# Patient Record
Sex: Female | Born: 1950 | Race: White | Hispanic: No | Smoking: Never smoker
Health system: Southern US, Community
[De-identification: ages and names within clinical notes are randomized; demographics above are authoritative.]

## PROBLEM LIST (undated history)

## (undated) DIAGNOSIS — F329 Major depressive disorder, single episode, unspecified: Secondary | ICD-10-CM

## (undated) DIAGNOSIS — K5090 Crohn's disease, unspecified, without complications: Secondary | ICD-10-CM

## (undated) DIAGNOSIS — I10 Essential (primary) hypertension: Secondary | ICD-10-CM

## (undated) DIAGNOSIS — R197 Diarrhea, unspecified: Secondary | ICD-10-CM

## (undated) DIAGNOSIS — E669 Obesity, unspecified: Secondary | ICD-10-CM

## (undated) DIAGNOSIS — K5000 Crohn's disease of small intestine without complications: Secondary | ICD-10-CM

## (undated) DIAGNOSIS — Z8614 Personal history of Methicillin resistant Staphylococcus aureus infection: Secondary | ICD-10-CM

## (undated) DIAGNOSIS — F419 Anxiety disorder, unspecified: Secondary | ICD-10-CM

## (undated) DIAGNOSIS — R42 Dizziness and giddiness: Secondary | ICD-10-CM

## (undated) DIAGNOSIS — K589 Irritable bowel syndrome without diarrhea: Secondary | ICD-10-CM

## (undated) DIAGNOSIS — F32A Depression, unspecified: Secondary | ICD-10-CM

## (undated) DIAGNOSIS — E785 Hyperlipidemia, unspecified: Secondary | ICD-10-CM

## (undated) DIAGNOSIS — H409 Unspecified glaucoma: Secondary | ICD-10-CM

## (undated) DIAGNOSIS — Z9889 Other specified postprocedural states: Secondary | ICD-10-CM

## (undated) DIAGNOSIS — K1379 Other lesions of oral mucosa: Secondary | ICD-10-CM

## (undated) DIAGNOSIS — M1990 Unspecified osteoarthritis, unspecified site: Secondary | ICD-10-CM

## (undated) DIAGNOSIS — K529 Noninfective gastroenteritis and colitis, unspecified: Secondary | ICD-10-CM

## (undated) DIAGNOSIS — E039 Hypothyroidism, unspecified: Secondary | ICD-10-CM

## (undated) DIAGNOSIS — R112 Nausea with vomiting, unspecified: Secondary | ICD-10-CM

## (undated) DIAGNOSIS — K219 Gastro-esophageal reflux disease without esophagitis: Secondary | ICD-10-CM

## (undated) DIAGNOSIS — K449 Diaphragmatic hernia without obstruction or gangrene: Secondary | ICD-10-CM

## (undated) HISTORY — DX: Hypothyroidism, unspecified: E03.9

## (undated) HISTORY — DX: Major depressive disorder, single episode, unspecified: F32.9

## (undated) HISTORY — PX: TONSILLECTOMY: SUR1361

## (undated) HISTORY — DX: Anxiety disorder, unspecified: F41.9

## (undated) HISTORY — PX: BACK SURGERY: SHX140

## (undated) HISTORY — DX: Depression, unspecified: F32.A

## (undated) HISTORY — PX: CHOLECYSTECTOMY: SHX55

## (undated) HISTORY — DX: Gastro-esophageal reflux disease without esophagitis: K21.9

## (undated) HISTORY — DX: Essential (primary) hypertension: I10

## (undated) HISTORY — PX: COLONOSCOPY: SHX174

## (undated) HISTORY — DX: Other lesions of oral mucosa: K13.79

## (undated) HISTORY — DX: Personal history of Methicillin resistant Staphylococcus aureus infection: Z86.14

## (undated) HISTORY — PX: CATARACT EXTRACTION, BILATERAL: SHX1313

## (undated) HISTORY — DX: Hyperlipidemia, unspecified: E78.5

## (undated) HISTORY — PX: UPPER GASTROINTESTINAL ENDOSCOPY: SHX188

## (undated) HISTORY — DX: Diarrhea, unspecified: R19.7

## (undated) HISTORY — PX: TUBAL LIGATION: SHX77

## (undated) HISTORY — DX: Crohn's disease of small intestine without complications: K50.00

## (undated) HISTORY — PX: COLON RESECTION: SHX5231

## (undated) HISTORY — DX: Noninfective gastroenteritis and colitis, unspecified: K52.9

## (undated) HISTORY — PX: ESOPHAGOGASTRODUODENOSCOPY: SHX1529

## (undated) HISTORY — DX: Obesity, unspecified: E66.9

## (undated) HISTORY — DX: Irritable bowel syndrome, unspecified: K58.9

---

## 1991-02-28 DIAGNOSIS — K5000 Crohn's disease of small intestine without complications: Secondary | ICD-10-CM

## 1991-02-28 HISTORY — DX: Crohn's disease of small intestine without complications: K50.00

## 1997-02-27 HISTORY — PX: THYROID SURGERY: SHX805

## 2000-10-03 ENCOUNTER — Ambulatory Visit (HOSPITAL_COMMUNITY): Admission: RE | Admit: 2000-10-03 | Discharge: 2000-10-03 | Payer: Self-pay | Admitting: *Deleted

## 2000-10-03 ENCOUNTER — Encounter (INDEPENDENT_AMBULATORY_CARE_PROVIDER_SITE_OTHER): Payer: Self-pay | Admitting: Specialist

## 2001-01-08 ENCOUNTER — Encounter: Admission: RE | Admit: 2001-01-08 | Discharge: 2001-01-08 | Payer: Self-pay | Admitting: *Deleted

## 2001-01-08 ENCOUNTER — Encounter: Payer: Self-pay | Admitting: *Deleted

## 2002-08-07 ENCOUNTER — Ambulatory Visit (HOSPITAL_COMMUNITY): Admission: RE | Admit: 2002-08-07 | Discharge: 2002-08-07 | Payer: Self-pay | Admitting: *Deleted

## 2002-08-07 ENCOUNTER — Encounter (INDEPENDENT_AMBULATORY_CARE_PROVIDER_SITE_OTHER): Payer: Self-pay | Admitting: *Deleted

## 2002-08-20 ENCOUNTER — Ambulatory Visit (HOSPITAL_COMMUNITY): Admission: RE | Admit: 2002-08-20 | Discharge: 2002-08-20 | Payer: Self-pay | Admitting: *Deleted

## 2002-08-20 ENCOUNTER — Encounter: Payer: Self-pay | Admitting: *Deleted

## 2003-09-24 ENCOUNTER — Ambulatory Visit (HOSPITAL_COMMUNITY): Admission: RE | Admit: 2003-09-24 | Discharge: 2003-09-24 | Payer: Self-pay | Admitting: Internal Medicine

## 2003-09-24 ENCOUNTER — Encounter (INDEPENDENT_AMBULATORY_CARE_PROVIDER_SITE_OTHER): Payer: Self-pay | Admitting: *Deleted

## 2005-03-02 ENCOUNTER — Encounter (INDEPENDENT_AMBULATORY_CARE_PROVIDER_SITE_OTHER): Payer: Self-pay | Admitting: Specialist

## 2005-03-02 ENCOUNTER — Ambulatory Visit (HOSPITAL_COMMUNITY): Admission: RE | Admit: 2005-03-02 | Discharge: 2005-03-02 | Payer: Self-pay | Admitting: *Deleted

## 2006-03-21 ENCOUNTER — Ambulatory Visit (HOSPITAL_COMMUNITY): Admission: RE | Admit: 2006-03-21 | Discharge: 2006-03-21 | Payer: Self-pay | Admitting: *Deleted

## 2007-01-15 ENCOUNTER — Ambulatory Visit (HOSPITAL_COMMUNITY): Admission: RE | Admit: 2007-01-15 | Discharge: 2007-01-15 | Payer: Self-pay | Admitting: *Deleted

## 2007-01-15 ENCOUNTER — Encounter (INDEPENDENT_AMBULATORY_CARE_PROVIDER_SITE_OTHER): Payer: Self-pay | Admitting: *Deleted

## 2008-02-06 ENCOUNTER — Ambulatory Visit (HOSPITAL_COMMUNITY): Admission: RE | Admit: 2008-02-06 | Discharge: 2008-02-06 | Payer: Self-pay | Admitting: *Deleted

## 2008-03-11 ENCOUNTER — Ambulatory Visit: Payer: Self-pay | Admitting: Gastroenterology

## 2008-03-16 ENCOUNTER — Encounter (HOSPITAL_COMMUNITY): Admission: RE | Admit: 2008-03-16 | Discharge: 2008-04-15 | Payer: Self-pay | Admitting: Endocrinology

## 2008-03-16 ENCOUNTER — Ambulatory Visit (HOSPITAL_COMMUNITY): Admission: RE | Admit: 2008-03-16 | Discharge: 2008-03-16 | Payer: Self-pay | Admitting: Gastroenterology

## 2008-03-17 ENCOUNTER — Ambulatory Visit: Payer: Self-pay | Admitting: Gastroenterology

## 2008-03-26 ENCOUNTER — Encounter: Payer: Self-pay | Admitting: Gastroenterology

## 2008-03-26 LAB — CONVERTED CEMR LAB
Basophils Absolute: 0 10*3/uL (ref 0.0–0.1)
Calcium: 9.8 mg/dL (ref 8.4–10.5)
Chloride: 97 meq/L (ref 96–112)
Creatinine, Ser: 0.79 mg/dL (ref 0.40–1.20)
Eosinophils Absolute: 0.1 10*3/uL (ref 0.0–0.7)
Eosinophils Relative: 2 % (ref 0–5)
HCT: 42.4 % (ref 36.0–46.0)
Lymphocytes Relative: 29 % (ref 12–46)
Neutrophils Relative %: 61 % (ref 43–77)
Platelets: 336 10*3/uL (ref 150–400)
RDW: 13 % (ref 11.5–15.5)
Sed Rate: 11 mm/hr (ref 0–22)
Sodium: 134 meq/L — ABNORMAL LOW (ref 135–145)

## 2008-06-24 ENCOUNTER — Telehealth (INDEPENDENT_AMBULATORY_CARE_PROVIDER_SITE_OTHER): Payer: Self-pay

## 2008-06-25 DIAGNOSIS — R142 Eructation: Secondary | ICD-10-CM

## 2008-06-25 DIAGNOSIS — F411 Generalized anxiety disorder: Secondary | ICD-10-CM | POA: Insufficient documentation

## 2008-06-25 DIAGNOSIS — R143 Flatulence: Secondary | ICD-10-CM

## 2008-06-25 DIAGNOSIS — F329 Major depressive disorder, single episode, unspecified: Secondary | ICD-10-CM

## 2008-06-25 DIAGNOSIS — K5010 Crohn's disease of large intestine without complications: Secondary | ICD-10-CM

## 2008-06-25 DIAGNOSIS — I10 Essential (primary) hypertension: Secondary | ICD-10-CM | POA: Insufficient documentation

## 2008-06-25 DIAGNOSIS — E785 Hyperlipidemia, unspecified: Secondary | ICD-10-CM

## 2008-06-25 DIAGNOSIS — R141 Gas pain: Secondary | ICD-10-CM

## 2008-06-25 DIAGNOSIS — K219 Gastro-esophageal reflux disease without esophagitis: Secondary | ICD-10-CM | POA: Insufficient documentation

## 2008-06-25 DIAGNOSIS — J45909 Unspecified asthma, uncomplicated: Secondary | ICD-10-CM | POA: Insufficient documentation

## 2008-06-26 ENCOUNTER — Ambulatory Visit: Payer: Self-pay | Admitting: Gastroenterology

## 2008-06-26 DIAGNOSIS — R1084 Generalized abdominal pain: Secondary | ICD-10-CM | POA: Insufficient documentation

## 2008-06-26 DIAGNOSIS — K1370 Unspecified lesions of oral mucosa: Secondary | ICD-10-CM

## 2008-06-29 ENCOUNTER — Encounter: Payer: Self-pay | Admitting: Gastroenterology

## 2008-08-28 ENCOUNTER — Telehealth (INDEPENDENT_AMBULATORY_CARE_PROVIDER_SITE_OTHER): Payer: Self-pay

## 2008-10-08 ENCOUNTER — Telehealth (INDEPENDENT_AMBULATORY_CARE_PROVIDER_SITE_OTHER): Payer: Self-pay

## 2008-11-04 ENCOUNTER — Ambulatory Visit: Payer: Self-pay | Admitting: Gastroenterology

## 2008-11-05 ENCOUNTER — Encounter: Payer: Self-pay | Admitting: Gastroenterology

## 2008-11-09 LAB — CONVERTED CEMR LAB
Bilirubin, Direct: 0.1 mg/dL (ref 0.0–0.3)
Indirect Bilirubin: 0.2 mg/dL (ref 0.0–0.9)

## 2009-01-15 ENCOUNTER — Encounter: Payer: Self-pay | Admitting: Urgent Care

## 2009-01-25 ENCOUNTER — Encounter: Payer: Self-pay | Admitting: Gastroenterology

## 2009-01-25 ENCOUNTER — Ambulatory Visit (HOSPITAL_COMMUNITY): Admission: RE | Admit: 2009-01-25 | Discharge: 2009-01-25 | Payer: Self-pay | Admitting: Gastroenterology

## 2009-01-25 ENCOUNTER — Ambulatory Visit: Payer: Self-pay | Admitting: Gastroenterology

## 2009-02-17 ENCOUNTER — Encounter (INDEPENDENT_AMBULATORY_CARE_PROVIDER_SITE_OTHER): Payer: Self-pay | Admitting: *Deleted

## 2009-04-09 ENCOUNTER — Ambulatory Visit: Payer: Self-pay | Admitting: Gastroenterology

## 2009-04-09 DIAGNOSIS — K6389 Other specified diseases of intestine: Secondary | ICD-10-CM | POA: Insufficient documentation

## 2009-04-09 DIAGNOSIS — R1310 Dysphagia, unspecified: Secondary | ICD-10-CM | POA: Insufficient documentation

## 2009-04-12 ENCOUNTER — Ambulatory Visit (HOSPITAL_COMMUNITY)
Admission: RE | Admit: 2009-04-12 | Discharge: 2009-04-12 | Payer: Self-pay | Source: Home / Self Care | Admitting: Gastroenterology

## 2009-05-12 ENCOUNTER — Telehealth (INDEPENDENT_AMBULATORY_CARE_PROVIDER_SITE_OTHER): Payer: Self-pay

## 2009-06-02 ENCOUNTER — Ambulatory Visit: Payer: Self-pay | Admitting: Gastroenterology

## 2009-07-27 ENCOUNTER — Telehealth: Payer: Self-pay | Admitting: Urgent Care

## 2009-07-29 ENCOUNTER — Encounter: Payer: Self-pay | Admitting: Urgent Care

## 2009-08-18 ENCOUNTER — Encounter: Payer: Self-pay | Admitting: Gastroenterology

## 2009-10-12 ENCOUNTER — Encounter: Payer: Self-pay | Admitting: Urgent Care

## 2009-10-19 ENCOUNTER — Ambulatory Visit: Payer: Self-pay | Admitting: Gastroenterology

## 2009-12-16 ENCOUNTER — Encounter (INDEPENDENT_AMBULATORY_CARE_PROVIDER_SITE_OTHER): Payer: Self-pay | Admitting: *Deleted

## 2009-12-20 ENCOUNTER — Telehealth (INDEPENDENT_AMBULATORY_CARE_PROVIDER_SITE_OTHER): Payer: Self-pay

## 2009-12-23 ENCOUNTER — Encounter: Payer: Self-pay | Admitting: Gastroenterology

## 2009-12-23 ENCOUNTER — Telehealth (INDEPENDENT_AMBULATORY_CARE_PROVIDER_SITE_OTHER): Payer: Self-pay | Admitting: *Deleted

## 2009-12-28 HISTORY — PX: COLONOSCOPY: SHX174

## 2010-01-13 ENCOUNTER — Ambulatory Visit: Payer: Self-pay | Admitting: Gastroenterology

## 2010-01-13 ENCOUNTER — Ambulatory Visit (HOSPITAL_COMMUNITY): Admission: RE | Admit: 2010-01-13 | Discharge: 2010-01-13 | Payer: Self-pay | Admitting: Gastroenterology

## 2010-03-01 ENCOUNTER — Telehealth (INDEPENDENT_AMBULATORY_CARE_PROVIDER_SITE_OTHER): Payer: Self-pay | Admitting: *Deleted

## 2010-03-02 ENCOUNTER — Telehealth (INDEPENDENT_AMBULATORY_CARE_PROVIDER_SITE_OTHER): Payer: Self-pay

## 2010-03-10 ENCOUNTER — Encounter: Payer: Self-pay | Admitting: Gastroenterology

## 2010-03-25 ENCOUNTER — Encounter: Payer: Self-pay | Admitting: Gastroenterology

## 2010-03-31 NOTE — Progress Notes (Signed)
  Phone Note Call from Patient   Summary of Call: Florian Buff called from Short Stay, Pt's pre-op visit is scheduled for Nov. 14th at 10am Initial call taken by: Diana Eves,  December 23, 2009 4:32 PM     Appended Document:  Noted.

## 2010-03-31 NOTE — Progress Notes (Signed)
Summary: schedule tcs  Phone Note Call from Patient Call back at Home Phone [phone]   Caller: Patient Summary of Call: pt called- she received letter about her tcs and is ready to be triaged and scheduled. Initial call taken by: Hendricks Limes LPN,  December 20, 2009 2:53 PM     Appended Document: schedule tcs Scheduled by Durward Mallard.

## 2010-03-31 NOTE — Medication Information (Signed)
Summary: Tax adviser   Imported By: Rosine Beat 08/18/2009 11:19:12  _____________________________________________________________________  External Attachment:    Type:   Image     Comment:   External Document

## 2010-03-31 NOTE — Progress Notes (Signed)
Summary: dexilant  Phone Note Call from Patient Call back at Home Phone [phone]   Caller: Patient Summary of Call: PA for Dexilant has been approved- pt uses mail order and stated it usually takes around 3 weeks for her to get her medications. pt wants to know if we can call in 1 month supply of dexilant to [facility] [place].  Initial call taken by: Hendricks Limes LPN,  March 02, 2010 3:37 PM     Appended Document: dexilant    Prescriptions: DEXILANT 60 MG CPDR (DEXLANSOPRAZOLE) take 1 by mouth daily  #31 x 0   Entered and Authorized by:   Gerrit Halls NP   Signed by:   Gerrit Halls NP on 03/02/2010   Method used:   Faxed to ...       [facility] [address] (retail)       [address] [place], [place]  [address]       Ph: [phone]       Fax: [phone]   RxID:   [id number]

## 2010-03-31 NOTE — Progress Notes (Signed)
Summary: DEXILANT  ---- Converted from flag ---- ---- 07/20/2009 12:22 PM, Peggyann Shoals wrote: Pt needs a Rx for Dexilant 60mg  sent to [email or web address] She can be reached @ [phone] ------------------------------       New/Updated Medications: DEXILANT 60 MG CPDR (DEXLANSOPRAZOLE) 1 by mouth EVERY MORNING for acid reflux Prescriptions: DEXILANT 60 MG CPDR (DEXLANSOPRAZOLE) 1 by mouth EVERY MORNING for acid reflux  #90 x 3   Entered and Authorized by:   Joselyn Arrow FNP-BC   Signed by:   Joselyn Arrow FNP-BC on 07/27/2009   Method used:   Printed then faxed to ...       [facility] [address] (retail)       [address] [place], [place]  [address]       Ph: [phone]       Fax: [phone]   RxID:   [id number] DEXILANT 60 MG CPDR (DEXLANSOPRAZOLE) 1 by mouth EVERY MORNING for acid reflux  #90 x 3   Entered and Authorized by:   Joselyn Arrow FNP-BC   Signed by:   Joselyn Arrow FNP-BC on 07/27/2009   Method used:   Electronically to        [facility] [phone]* (retail)       [address] [place], [place]  [address]       Ph: [phone]       Fax: [phone]   RxID:   [id number]  Dirsregard first rx.  This needs to be faxed as above.  Appended Document: DEXILANT Faxed on 07/27/2009 by Raynelle Fanning.

## 2010-03-31 NOTE — Assessment & Plan Note (Signed)
Summary: GERD, INTERMITTENT DIARRHEA   Visit Type:  Follow-up Visit Primary Care Icelynn Onken:  Dorna Leitz, M.D.  Chief Complaint:  heartburn.  History of Present Illness: Bad UTI  " finally" resolved with many courses of Abx and nose bleeds since last visit. Saw ENT had MRSA infxn and Abx in her nose. Heartburn "pretty bad"-insurance changed meds to Protonix. Gained 21 lbs in past year. Bms: using probiotics has helped. BMs: 2-3-combo-loose, watery, or formed. RARE ABD PAIN  Current Medications (verified): 1)  Bystolic 5 Mg Tabs (Nebivolol Hcl) .... Take One To Two Tablets Daily 2)  Xanax 0.25 Mg Tabs (Alprazolam) .... Take 1 Tablet By Mouth Three Times A Day 3)  Wellbutrin Sr 200 Mg Xr12h-Tab (Bupropion Hcl) .... Take 1 Tablet By Mouth Once A Day 4)  Prozac 20 Mg Caps (Fluoxetine Hcl) .... Three Times A Day 5)  Aspirin 81 Mg Tabs (Aspirin) .... Take 1 Tablet By Mouth Once A Day 6)  Pentasa 250 Mg Cr-Caps (Mesalamine) .... Take 3 Capsules Three Times Daily 7)  Advair Diskus 100-50 Mcg/dose Misc (Fluticasone-Salmeterol) .... Two Times A Day 8)  Promethazine Hcl 25 Mg Tabs (Promethazine Hcl) .... One By Mouth Every 6-8 Hours Prn 9)  Calcium 500/d 500-200 Mg-Unit Tabs (Calcium Carbonate-Vitamin D) .... Take 1 Tablet By Mouth Once A Day 10)  Femhart .... Once Daily 11)  Carafate 1 Gm/54ml Susp (Sucralfate) .Marland Kitchen.. 1 Gm By Mouth Qid As Needed Heartburn 12)  Allegra 180 Mg Tabs (Fexofenadine Hcl) .... Once Daily As Needed 13)  Hyzaar 100-12.5 Mg Tabs (Losartan Potassium-Hctz) .... Once Daily 14)  Protonix 40 Mg Tbec (Pantoprazole Sodium) .... Once Daily  Allergies (verified): 1)  ! Pcn 2)  ! Codeine 3)  ! Fentanyl (Fentanyl) 4)  ! Sulfa  Past History:  Past Surgical History: Last updated: 11/04/2008 Back Surgery Cholecystectomy Thyroid Surgery Tonsillectomy Resection of terminal ileum and right colon 1993-PATH: terminal ileitis, no granulomas or crypt distortion  Family History: Last  updated: 06/26/2008 No FH of Colon Cancer or polyps.  Past Medical History: Small Bowel Bacterial Overgrowth, Abx: Jan 2010, May 2010 Mouth Sores ?Crohn's Disease **NOV 2010: IleoTCS: nl colon Dysphagia **2002: BaSw-sml HH, GERD, 2008:EGD/dil-Dr. Virginia Rochester, 2010: EGD/dil-29mm Sav GERD Anxiety Disorder Asthma Depression Hyperlipidemia Hypertension Hypothyroidism  Vital Signs:  Patient profile:   60 year old female Height:      60 inches Weight:      200 pounds BMI:     39.20 Temp:     97.6 degrees F oral Pulse rate:   64 / minute BP sitting:   124 / 80  (right arm) Cuff size:   regular  Vitals Entered By: Hendricks Limes LPN (October 19, 2009 8:21 AM)  Physical Exam  General:  Well developed, well nourished, no acute distress. Head:  Normocephalic and atraumatic. Eyes:  PERRL, no icterus, RIGHT lens implant Mouth:  No deformity or lesions. Neck:  Supple; no masses. Lungs:  Clear throughout to auscultation. Heart:  Regular rate and rhythm; no murmurs. Abdomen:  Soft, nontender and nondistended.  Normal bowel sounds. obese.   Extremities:  No edema noted. Neurologic:  Alert and  oriented x4;  grossly normal neurologically.  Impression & Recommendations:  Problem # 1:  GERD (ICD-530.81) Assessment Deteriorated AFTER INSURANCE MADE HER TAKE PROTONIX. Rx given for two times a day dosing. Lose 10-20 lbs. OPV in 6 mos.  Problem # 2:  CROHN'S DISEASE (ICD-555.9) Assessment: Unchanged No evidence of active disease. Last TCS NOV 2010-benign colon. ?dx  of Crohns disease at age 42. TCS q1-2 years. Avoid dairy. LOW FAT DIET. Continue Pentasa.  CC: PCP Prescriptions: PROMETHAZINE HCL 25 MG TABS (PROMETHAZINE HCL) one by mouth every 6-8 hours prn  #30 x 3   Entered and Authorized by:   West Bali MD   Signed by:   West Bali MD on 10/19/2009   Method used:   Electronically to        [facility] [phone]* (retail)       [address] [place], [place]  [address]       Ph: [phone]       Fax: [phone]   RxID:   [id number] PROTONIX 40 MG TBEC (PANTOPRAZOLE SODIUM) 1 by mouth 30 minutes prior to meals two times a day  #180 x 1   Entered and Authorized by:   West Bali MD   Signed by:   West Bali MD on 10/19/2009   Method used:   Print then Give to Patient   RxID:   [id number]   Appended Document: GERD, INTERMITTENT DIARRHEA 6 MONTH F/U OV IS IN THE COMPUTER/LAW  Appended Document: GERD, INTERMITTENT DIARRHEA 6 MONTH F/U OV IS IN THE COMPUTER  Appended Document: Orders Update    Clinical Lists Changes  Orders: Added new Service order of Est. Patient Level II (34742) - Signed

## 2010-03-31 NOTE — Medication Information (Signed)
Summary: dexilant PA  dexilant PA   Imported By: Hendricks Limes LPN 60/45/4098 11:91:47  _____________________________________________________________________  External Attachment:    Type:   Image     Comment:   External Document

## 2010-03-31 NOTE — Assessment & Plan Note (Signed)
Summary: GERD, ABD PAIN   Visit Type:  Follow-up Visit Primary Care Provider:  Dorna Leitz, M.D.  Chief Complaint:  still having some burning.  History of Present Illness: Aciphex didn't help. Nexium has helped some. Still with burning all the time. No problems swallowing, but can be riding down the road and can get choked. Heart doctor told her she drinks too much. Going to see a weight loss MD and taking her daughter with her. SHE IS AN EMOTIONAL EATER. BMs: 1-2x/day, watery, soft, nl formed. Thinks PROBIOTICS HELPED.  Stays nauseated. Takes PHENERGAN ONCE A DAY.  Current Medications (verified): 1)  Bystolic 5 Mg Tabs (Nebivolol Hcl) .... Take One To Two Tablets Daily 2)  Xanax 0.25 Mg Tabs (Alprazolam) .... Take 1 Tablet By Mouth Three Times A Day 3)  Wellbutrin Sr 200 Mg Xr12h-Tab (Bupropion Hcl) .... Take 1 Tablet By Mouth Once A Day 4)  Prozac 20 Mg Caps (Fluoxetine Hcl) .... Three Times A Day 5)  Aspirin 81 Mg Tabs (Aspirin) .... Take 1 Tablet By Mouth Once A Day 6)  Pentasa 250 Mg Cr-Caps (Mesalamine) .... Take 3 Capsules Three Times Daily 7)  Advair Diskus 100-50 Mcg/dose Misc (Fluticasone-Salmeterol) .... Two Times A Day 8)  Nexium 40 Mg Cpdr (Esomeprazole Magnesium) .... Take 1 Tablet By Mouth Two Times A Day 9)  Promethazine Hcl 25 Mg Tabs (Promethazine Hcl) .... One By Mouth Every 6-8 Hours Prn 10)  Calcium 500/d 500-200 Mg-Unit Tabs (Calcium Carbonate-Vitamin D) .... Take 1 Tablet By Mouth Once A Day 11)  Femhart .... Once Daily 12)  Carafate 1 Gm/29ml Susp (Sucralfate) .Marland Kitchen.. 1 Gm By Mouth Qid As Needed Heartburn 13)  Allegra 180 Mg Tabs (Fexofenadine Hcl) .... Once Daily As Needed 14)  Hyzaar 100-12.5 Mg Tabs (Losartan Potassium-Hctz) .... Once Daily  Allergies (verified): 1)  ! Pcn 2)  ! Codeine 3)  ! Fentanyl (Fentanyl) 4)  ! Sulfa  Past History:  Past Medical History: Last updated: 04/09/2009 Small Bowel Bacterial Overgrowth, Abx: Jan 2010, May 2010 Mouth  Sores ?Crohn's Disease **2010: IleoTCS: nl colon Dysphagia **2002: BaSw-sml HH, GERD, 2008:EGD/dil-Dr. Virginia Rochester, 2010: EGD/dil-20mm Sav GERD Anxiety Disorder Asthma Depression Hyperlipidemia Hypertension Hypothyroidism  Review of Systems       APR 2010: 179 lbs  PASSED OUT GIVING BLOOD. SBP DROPPED AND LAST ONE WAS 90/50.  Vital Signs:  Patient profile:   60 year old female Height:      60 inches Weight:      196 pounds BMI:     38.42 Temp:     98.0 degrees F oral Pulse rate:   72 / minute BP sitting:   132 / 80  (right arm) Cuff size:   regular  Vitals Entered By: Hendricks Limes LPN (June 02, 1608 10:37 AM)  Physical Exam  General:  Well developed, well nourished, no acute distress. Head:  Normocephalic and atraumatic. Eyes:  PERRLA, no icterus, RIGHT lens implant Mouth:  No deformity or lesions, dentition normal. Lungs:  Clear throughout to auscultation. Heart:  Regular rate and rhythm; no murmurs. Abdomen:  Soft, nontender and nondistended.  Normal bowel sounds. obese.    Impression & Recommendations:  Problem # 1:  GERD (ICD-530.81) Assessment Improved  Sx not ideally controlled on Nexium. Try Dexilant once AM. Samples given, #10. Rx sent. OPV in 4 mos.  CC: PCP  Orders: Est. Patient Level II ([address])  Problem # 2:  ABDOMINAL PAIN, GENERALIZED (ICD-789.07) Assessment: Improved  Continue probiotics. OPV in  4 mos.  Orders: Est. Patient Level II ([address]) Prescriptions: DEXILANT 60 MG CPDR (DEXLANSOPRAZOLE) 1 by mouth EVERY MORNING  #30 x 5   Entered and Authorized by:   West Bali MD   Signed by:   West Bali MD on 06/02/2009   Method used:   Electronically to        [facility] [phone]* (retail)       [address] [place], [place]  [address]       Ph: [phone]       Fax: [phone]   RxID:   [id number]

## 2010-03-31 NOTE — Assessment & Plan Note (Signed)
Summary: DYSPHAGIA, Crohn's   Visit Type:  Follow-up Visit Primary Care Provider:  Dorna Leitz, M.D.  Chief Complaint:  dysphagia.  History of Present Illness: Feeling choked on liquids and solids. Stays nauseated and Rx: Phenergan. No vomiting. Appetite:  OCT 31-found little brother dead. Autopsy pending. She was real close to him. Seeing a [facility]HS to deal with the death.  Current Medications (verified): 1)  Bystolic 5 Mg Tabs (Nebivolol Hcl) .... Take One To Two Tablets Daily 2)  Xanax 0.25 Mg Tabs (Alprazolam) .... Take 1 Tablet By Mouth Three Times A Day 3)  Wellbutrin Sr 200 Mg Xr12h-Tab (Bupropion Hcl) .... Take 1 Tablet By Mouth Once A Day 4)  Prozac 20 Mg Caps (Fluoxetine Hcl) .... Three Times A Day 5)  Aspirin 81 Mg Tabs (Aspirin) .... Take 1 Tablet By Mouth Once A Day 6)  Pentasa 250 Mg Cr-Caps (Mesalamine) .... Take 3 Capsules Three Times Daily 7)  Advair Diskus 100-50 Mcg/dose Misc (Fluticasone-Salmeterol) .... Two Times A Day 8)  Nexium 40 Mg Cpdr (Esomeprazole Magnesium) .... Take 1 Tablet By Mouth Two Times A Day 9)  Promethazine Hcl 25 Mg Tabs (Promethazine Hcl) .... One By Mouth Every 6-8 Hours Prn 10)  Calcium 500/d 500-200 Mg-Unit Tabs (Calcium Carbonate-Vitamin D) .... Take 1 Tablet By Mouth Once A Day 11)  Femhart .... Once Daily 12)  Carafate 1 Gm/76ml Susp (Sucralfate) .Marland Kitchen.. 1 Gm By Mouth Qid As Needed Heartburn 13)  Allegra 180 Mg Tabs (Fexofenadine Hcl) .... Once Daily As Needed 14)  Hyzaar 100-12.5 Mg Tabs (Losartan Potassium-Hctz) .... Once Daily  Allergies (verified): 1)  ! Pcn 2)  ! Codeine 3)  ! Fentanyl (Fentanyl) 4)  ! Sulfa  Past History:  Past Surgical History: Last updated: 11/04/2008 Back Surgery Cholecystectomy Thyroid Surgery Tonsillectomy Resection of terminal ileum and right colon 1993-PATH: terminal ileitis, no granulomas or crypt distortion  Family History: Last updated: 06/26/2008 No FH of Colon Cancer or polyps.  Past Medical  History: Small Bowel Bacterial Overgrowth, Abx: Jan 2010, May 2010 Mouth Sores ?Crohn's Disease **2010: IleoTCS: nl colon Dysphagia **2002: BaSw-sml HH, GERD, 2008:EGD/dil-Dr. Virginia Rochester, 2010: EGD/dil-28mm Sav GERD Anxiety Disorder Asthma Depression Hyperlipidemia Hypertension Hypothyroidism  Vital Signs:  Patient profile:   60 year old female Height:      60 inches Weight:      191 pounds BMI:     37.44 Temp:     97.3 degrees F oral Pulse rate:   80 / minute BP sitting:   124 / 80  (left arm) Cuff size:   regular  Vitals Entered By: Hendricks Limes LPN (April 09, 2009 9:28 AM)  Physical Exam  General:  Well developed, well nourished, no acute distress. Head:  Normocephalic and atraumatic. Lungs:  Clear throughout to auscultation. Heart:  Regular rate and rhythm; no murmurs Abdomen:  Soft, nontender and nondistended. Normal bowel sounds. Extremities:  No edema noted. Neurologic:  Alert and  oriented x4;  grossly normal neurologically.  Impression & Recommendations:  Problem # 1:  DYSPHAGIA (ICD-787.20) Assessment Unchanged  Pt still c/o solid and liquid dysphagia. Differential diagnosis primary esophageal motility disorder, uncontroled GERD, non-ulcer dyspepsia. BaSw with BPE. OPV in 2 mos. Continue Nexium.  Orders: Est. Patient Level III ([address])  Problem # 2:  GERD (ICD-530.81) Assessment: Unchanged  Problem # 3:  CROHN'S DISEASE (ICD-555.9) Assessment: Improved  Sx improved with probiotics. Refilled Pentasa for 1 year.  Orders: Est. 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 Pt may periodically need Abx for diarrhea and bloating.  C: PCP Prescriptions: PENTASA 250 MG CR-CAPS (MESALAMINE) Take 3 capsules three times daily  #3 mo supply x 3   Entered and Authorized by:   West Bali MD   Signed by:   West Bali MD on  04/09/2009   Method used:   Print then Give to Patient   RxID:   [id number] PROMETHAZINE HCL 25 MG TABS (PROMETHAZINE HCL) one by mouth every 6-8 hours prn  #30 x 5   Entered and Authorized by:   West Bali MD   Signed by:   West Bali MD on 04/09/2009   Method used:   Electronically to        [facility] [phone]* (retail)       [address] [place], [place]  [address]       Ph: [phone]       Fax: [phone]   RxID:   [id number]

## 2010-03-31 NOTE — Letter (Signed)
Summary: BPE ORDER  BPE ORDER   Imported By: Ave Filter 04/09/2009 09:54:06  _____________________________________________________________________  External Attachment:    Type:   Image     Comment:   External Document

## 2010-03-31 NOTE — Letter (Signed)
Summary: TRIAGE ORDER  TRIAGE ORDER   Imported By: Ave Filter 12/23/2009 16:24:25  _____________________________________________________________________  External Attachment:    Type:   Image     Comment:   External Document

## 2010-03-31 NOTE — Medication Information (Signed)
Summary: PROMETHAZINE 25MG   PROMETHAZINE 25MG    Imported By: Rexene Alberts 03/10/2010 14:39:36  _____________________________________________________________________  External Attachment:    Type:   Image     Comment:   External Document  Appended Document: PROMETHAZINE 25MG     Prescriptions: PROMETHAZINE HCL 25 MG TABS (PROMETHAZINE HCL) one by mouth every 6-8 hours prn  #30 x 1   Entered and Authorized by:   Gerrit Halls NP   Signed by:   Gerrit Halls NP on 03/10/2010   Method used:   Faxed to ...       [facility] [address] (retail)       [address] [place], [place]  [address]       Ph: [phone]       Fax: [phone]   RxID:   [id number]

## 2010-03-31 NOTE — Progress Notes (Signed)
Summary: Aciphex not working  Phone Note Call from Patient   Caller: Patient Summary of Call: Pt called and said the Aciphex Dr. Darrick Penna gave her is not working at all. Said she has been in bed for the last couple of days with the burning from reflux. Please advise. Initial call taken by: Cloria Spring LPN,  May 12, 2009 1:45 PM     Appended Document: Aciphex not working Please call pt. She should go back to Nexium two times a day and use her Carafate. Stay ona  Full liquid diet for the enxt three days. Avoid fatty foods.  Appended Document: Aciphex not working Pt informed.

## 2010-03-31 NOTE — Medication Information (Signed)
Summary: PROMETHAZINE 25MG   PROMETHAZINE 25MG    Imported By: Rexene Alberts 10/12/2009 10:37:12  _____________________________________________________________________  External Attachment:    Type:   Image     Comment:   External Document  Appended Document: PROMETHAZINE 25MG     Prescriptions: PROMETHAZINE HCL 25 MG TABS (PROMETHAZINE HCL) one by mouth every 6-8 hours prn  #30 x 0   Entered and Authorized by:   Joselyn Arrow FNP-BC   Signed by:   Joselyn Arrow FNP-BC on 10/12/2009   Method used:   Electronically to        [facility] [phone]* (retail)       [address] [place], [place]  [address]       Ph: [phone]       Fax: [phone]   RxID:   [id number]

## 2010-03-31 NOTE — Letter (Signed)
Summary: Recall, Screening Colonoscopy Only  [facility]  [address]   [place], [place] [address]   Phone: [phone]  Fax: [phone]    December 16, 2009  Stacy Elliott [address] [place], [place]  [address] Jan 31, 1951   Dear [name],   Our records indicate it is time to schedule your colonoscopy.    Please call our office at [phone] and ask for the nurse.   Thank you,    Hendricks Limes, LPN Cloria Spring, LPN  [facility] Ph: [phone]   Fax: [phone]

## 2010-03-31 NOTE — Progress Notes (Signed)
Summary: Dexilant Refill  Phone Note Call from Patient Call back at Home Phone [phone]   Caller: Patient Reason for Call: Refill Medication Summary of Call: Patient called and wanted to know if we could call express script and let them know she really need to take Dexilant. Dr Darrick Penna prescribed the meidcation for her several moths ago but her ins wouldn't pay for it. She thinks that her ins will pay for it now if we give them a call([phone]). Initial call taken by: Ave Filter,  March 01, 2010 11:27 AM     Appended Document: Dexilant Refill Please call for PA Thanks  Appended Document: Dexilant Refill PA form on AS cart

## 2010-03-31 NOTE — Medication Information (Signed)
Summary: rx fax form  rx fax form   Imported By: Hendricks Limes LPN 74/25/9563 87:56:43  _____________________________________________________________________  External Attachment:    Type:   Image     Comment:   External Document

## 2010-03-31 NOTE — Letter (Signed)
Summary: Recall, Screening Colonoscopy Only  [facility]  [address]   [place], [place] [address]   Phone: [phone]  Fax: [phone]    December 16, 2009  Stacy Elliott [address] [place], [place]  [address] Aug 12, 1950   Dear Stacy Elliott,   Our records indicate it is time to schedule your colonoscopy.   Please call our office at [phone] and ask for the nurse.   Thank you,    Hendricks Limes, LPN Cloria Spring, LPN  [facility] Ph: [phone]   Fax: [phone]   Appended Document: Recall, Screening Colonoscopy Only pt called to be triage. she recieved her letter and has LMOM. You can reach her at [phone]  Appended Document: Recall, Screening Colonoscopy Only Pt scheduled for procedure 01/13/10@8 :30am

## 2010-04-18 ENCOUNTER — Ambulatory Visit: Payer: Self-pay | Admitting: Gastroenterology

## 2010-05-04 ENCOUNTER — Encounter: Payer: Self-pay | Admitting: Gastroenterology

## 2010-05-10 LAB — HEMOGLOBIN AND HEMATOCRIT, BLOOD: HCT: 37.4 % (ref 36.0–46.0)

## 2010-05-10 LAB — BASIC METABOLIC PANEL
CO2: 26 mEq/L (ref 19–32)
Chloride: 98 mEq/L (ref 96–112)
GFR calc Af Amer: >60 mL/min (ref >60)
Potassium: 3.5 mEq/L (ref 3.5–5.1)
Sodium: 134 mEq/L — ABNORMAL LOW (ref 135–145)

## 2010-05-12 ENCOUNTER — Ambulatory Visit (INDEPENDENT_AMBULATORY_CARE_PROVIDER_SITE_OTHER): Payer: Medicare Other | Admitting: Gastroenterology

## 2010-05-12 ENCOUNTER — Encounter: Payer: Self-pay | Admitting: Internal Medicine

## 2010-05-12 ENCOUNTER — Encounter: Payer: Self-pay | Admitting: Gastroenterology

## 2010-05-12 DIAGNOSIS — R1310 Dysphagia, unspecified: Secondary | ICD-10-CM

## 2010-05-12 DIAGNOSIS — K5090 Crohn's disease, unspecified, without complications: Secondary | ICD-10-CM

## 2010-05-17 NOTE — Letter (Signed)
Summary: egd/ed order  egd/ed order   Imported By: Ave Filter 05/12/2010 14:36:39  _____________________________________________________________________  External Attachment:    Type:   Image     Comment:   External Document

## 2010-05-26 ENCOUNTER — Encounter: Payer: Medicare Other | Admitting: Internal Medicine

## 2010-05-26 ENCOUNTER — Ambulatory Visit (HOSPITAL_COMMUNITY)
Admission: RE | Admit: 2010-05-26 | Discharge: 2010-05-26 | Disposition: A | Discharge status: Home / Self Care | Payer: Medicare Other | Source: Ambulatory Visit | Attending: Internal Medicine | Admitting: Internal Medicine

## 2010-05-26 DIAGNOSIS — D131 Benign neoplasm of stomach: Secondary | ICD-10-CM

## 2010-05-26 DIAGNOSIS — K222 Esophageal obstruction: Secondary | ICD-10-CM | POA: Insufficient documentation

## 2010-05-26 DIAGNOSIS — I10 Essential (primary) hypertension: Secondary | ICD-10-CM | POA: Insufficient documentation

## 2010-05-26 DIAGNOSIS — R1310 Dysphagia, unspecified: Secondary | ICD-10-CM

## 2010-05-26 DIAGNOSIS — Z79899 Other long term (current) drug therapy: Secondary | ICD-10-CM | POA: Insufficient documentation

## 2010-05-26 DIAGNOSIS — K449 Diaphragmatic hernia without obstruction or gangrene: Secondary | ICD-10-CM | POA: Insufficient documentation

## 2010-05-26 DIAGNOSIS — E785 Hyperlipidemia, unspecified: Secondary | ICD-10-CM | POA: Insufficient documentation

## 2010-05-26 DIAGNOSIS — Z7982 Long term (current) use of aspirin: Secondary | ICD-10-CM | POA: Insufficient documentation

## 2010-05-26 NOTE — Assessment & Plan Note (Signed)
Summary: DYSPHAGIA   Vital Signs:  Patient profile:   60 year old female Height:      60 inches Weight:      147.50 pounds BMI:     28.91 Temp:     97.8 degrees F oral Pulse rate:   68 / minute BP sitting:   120 / 80  (left arm)  Vitals Entered By: Carolan Clines LPN (May 12, 2010 1:46 PM)  Visit Type:  Follow-up Visit Primary Care Provider:  Dorna Leitz, M.D.   History of Present Illness: Pt presents in f/u from Crohns disease. Maintained on Pentasa. At baseline. Hasn't had a BM since Monday, which is not unusual for her. No rectal bleeding. On Protonix twice/day. Has tried Dexilant in distant past and did quite well, but she quit secondary to insurance not covering. Every day experiences indigestion. Reports dysphagia and strangling at night. has worsened since last office visit. Has had multiple EGDs in past secondary to dysphagia/reflux. Last EGD in 2009 with Dr. Virginia Rochester: negative. 2008 Dr. Virginia Rochester: savary dilatation.  takes Phenergan as needed for nausea. no loss of appetite. No emesis.  .   Current Medications (verified): 1)  Carvedilol 12.5 Mg Tabs (Carvedilol) .... Take One Two Times A Day 2)  Xanax 0.5 Mg Tabs (Alprazolam) .... Take One Two Times A Day 3)  Wellbutrin Sr 200 Mg Xr12h-Tab (Bupropion Hcl) .... Take 1 Tablet By Mouth Once A Day 4)  Prozac 20 Mg Caps (Fluoxetine Hcl) .... Three Times A Day 5)  Aspirin 81 Mg Tabs (Aspirin) .... Take 1 Tablet By Mouth Once A Day 6)  Pentasa 250 Mg Cr-Caps (Mesalamine) .... Take 3 Capsules Three Times Daily 7)  Promethazine Hcl 25 Mg Tabs (Promethazine Hcl) .... One By Mouth Every 6-8 Hours Prn 8)  Carafate 1 Gm/40ml Susp (Sucralfate) .Marland Kitchen.. 1 Gm By Mouth Qid As Needed Heartburn 9)  Allegra 180 Mg Tabs (Fexofenadine Hcl) .... Once Daily As Needed 10)  Hyzaar 100-12.5 Mg Tabs (Losartan Potassium-Hctz) .... Once Daily 11)  Protonix 40 Mg Tbec (Pantoprazole Sodium) .Marland Kitchen.. 1 By Mouth 30 Minutes Prior To Meals Two Times A Day 12)  Dexilant 60 Mg Cpdr  (Dexlansoprazole) .... Take 1 By Mouth Daily 13)  Pravastatin Sodium 80 Mg Tabs (Pravastatin Sodium) .... Take One Once Daily 14)  Jinteli 1-5 Mg-Mcg Tabs (Norethindrone-Eth Estradiol) .... Take One Half Once Daily 15)  Proair Hfa 108 (90 Base) Mcg/act Aers (Albuterol Sulfate) .... Prn 16)  Singulair 10 Mg Tabs (Montelukast Sodium) .... Take One Once Daily  Allergies: 1)  ! Pcn 2)  ! Codeine 3)  ! Fentanyl (Fentanyl) 4)  ! Sulfa  Past History:  Past Medical History: Small Bowel Bacterial Overgrowth, Abx: Jan 2010, May 2010 Mouth Sores Crohn's Disease **NOV 2010: IleoTCS: nl colon Dysphagia **2002: BaSw-sml HH, GERD, 2008:EGD/dil-Dr. Virginia Rochester, 2010: EGD/dil-65mm Sav GERD Anxiety Disorder Asthma Depression Hyperlipidemia Hypertension Hypothyroidism Nov 2011 TCS: biopsy nl. Repeat 2013  Past Surgical History: Reviewed history from 11/04/2008 and no changes required. Back Surgery Cholecystectomy Thyroid Surgery Tonsillectomy Resection of terminal ileum and right colon 1993-[facility]: terminal ileitis, no granulomas or crypt distortion  Family History: Reviewed history from 06/26/2008 and no changes required. No FH of Colon Cancer or polyps.  Social History: Disability secondary to husband's passing 5 years ago Widowed, 1 daughter Patient has never smoked.  Alcohol Use - no Illicit Drug Use - no Smoking Status:  never Drug Use:  no  Review of Systems General:  Denies fever, chills, and  anorexia. Eyes:  Denies blurring, irritation, and discharge. ENT:  Complains of difficulty swallowing; denies sore throat and hoarseness. CV:  Denies chest pains and syncope. Resp:  Denies dyspnea at rest and wheezing. GI:  See HPI. GU:  Denies urinary burning and urinary frequency. MS:  Denies joint pain / LOM, joint swelling, and joint stiffness. Derm:  Denies rash, itching, and dry skin. Neuro:  Denies weakness and syncope. Psych:  Denies depression and anxiety. Endo:  Denies cold  intolerance and heat intolerance.  Physical Exam  General:  Well developed, well nourished, no acute distress. Head:  Normocephalic and atraumatic. Eyes:  sclera without icterus Mouth:  No deformity or lesions, dentition normal. Lungs:  Clear throughout to auscultation. Heart:  Regular rate and rhythm; no murmurs, rubs,  or bruits. Abdomen:  +BS, soft, non-tender, non-distended. no rebound or guarding, no HSM no masses noted.  Msk:  Symmetrical with no gross deformities. Normal posture. Neurologic:  Alert and  oriented x4;  grossly normal neurologically. Skin:  Intact without significant lesions or rashes. Psych:  Alert and cooperative. Normal mood and affect.   Impression & Recommendations:  Problem # 1:  CROHN'S DISEASE (ICD-555.9)  Hx of Crohn's, s/p hemicolectomy in remote past. At baseline, maintained on Pentasa. Doing well, last colonoscopy Nov 2011 with benign biopsies. Due for repeat in 2013. Continue pentasa.   Orders: Est. Patient Level II (04540)  Problem # 2:  DYSPHAGIA (ICD-35.58)  60 year old plesant Caucasian female with hx of dysphagia in past, last dilation in 2008 by Dr. Virginia Rochester with 18mm savary dilator. Worsening GERD, on Protonix twice daily with continued GERD. has done well on Dexilant in past but could not continue secondary to insurance. Will set up for EGD/ED secondary to dysphagia; trial of Dexilant.   EGD/ED with Dr. Jena Gauss in near future: the R/B/A have been discussed in detail; pt states understanding and desires to proceed.  Dexilant samples, rx sent to pharmacy of choice Phenergan refills as needed:  F/U in 3 mos  Orders: Est. Patient Level II (98119) Prescriptions: PROMETHAZINE HCL 25 MG TABS (PROMETHAZINE HCL) one by mouth every 6-8 hours prn  #30 x 3   Entered and Authorized by:   Gerrit Halls NP   Signed by:   Gerrit Halls NP on 05/12/2010   Method used:   Faxed to ...       [facility] [address]* (retail)       [address] [place], [place]  [address]       Ph: [phone]       Fax: [phone]   RxID:   [id number] DEXILANT 60 MG CPDR (DEXLANSOPRAZOLE) take 1 30 minutes before breakfast  #31 x 5   Entered and Authorized by:   Gerrit Halls NP   Signed by:   Gerrit Halls NP on 05/12/2010   Method used:   Faxed to ...       [facility] [address] (retail)       [address] [place], [place]  [address]       Ph: [phone]       Fax: [phone]   RxID:   [id number]    Orders Added: 1)  Est. Patient Level II [32355]  Appended Document: DYSPHAGIA 3 MONTH F/U OPV IS IN THE COMPUTER

## 2010-06-07 NOTE — Op Note (Signed)
  NAME:  Stacy Elliott, DEEMER             ACCOUNT NO.:  [id number]  MEDICAL RECORD NO.:  [id number]           PATIENT TYPE:  O  LOCATION:  DAYP                          FACILITY:  [facility]  PHYSICIAN:  R. Roetta Sessions, M.D. DATE OF BIRTH:  1950/11/06  DATE OF PROCEDURE:  05/26/2010 DATE OF DISCHARGE:                              OPERATIVE REPORT   PROCEDURE:  EGD with Elease Hashimoto dilation.  INDICATIONS FOR PROCEDURE:  A 60 year old lady with recurrent esophageal dysphagia secondary to Schatzki's ring, history of reflux, more recently better controlled on Dexilant 60 mg orally daily.  EGD with esophageal dilation is appropriate except for now being done.  Risks, benefits, limitations, alternatives, and imponderables have been discussed, questions answered.  Please see the documentation in the medical record.  PROCEDURE NOTE:  O2 saturation, blood pressure, pulse, respirations were monitored throughout the entire procedure.  CONSCIOUS SEDATION:  Versed 6 mg IV, Demerol 125 mg IV in divided doses. Phenergan 25 mg diluted slow IV push to augment conscious sedation. Cetacaine spray for topical pharyngeal anesthesia.  INSTRUMENT:  Pentax video chip system.  FINDINGS:  Examination of tubular esophagus revealed a noncritical incomplete Schatzki's ring, otherwise esophageal mucosa appeared entirely normal.  EG junction was easily traversed with a scope.  Stomach:  Gastric cavity was emptied and insufflated well with air. Thorough examination of gastric mucosa including retroflexion of proximal, esophagogastric junction demonstrated only a small hiatal hernia and couple of tiny fundal gland type polyps.  Pylorus was patent, easily traversed.  Examination of bulb and second portion revealed no abnormalities.  THERAPEUTIC/DIAGNOSTIC MANEUVERS PERFORMED:  Scope was withdrawn.  A 56- French Maloney dilator was passed to full insertion with ease.  A look back revealed no apparent complication related  to passage of the dilator.  The patient tolerated the procedure well, was reactive to endoscopy.  IMPRESSION:  Incomplete noncritical Schatzki's ring, otherwise normal esophagus status post passage of 56-French Maloney dilator, small hiatal hernia.  Tiny fundal gland type polyps, otherwise normal stomach D1 and D2.  RECOMMENDATIONS:  Continue Dexilant 60 mg orally daily.  Literature on reflux provided to Ms. Costantino.  Plan is to have her back in the office for followup in 6 months.     Jonathon Bellows, M.D.     RMR/MEDQ  D:  05/26/2010  T:  05/27/2010  Job:  [id number]  cc:   Suzy Bouchard Fax: [phone]  Electronically Signed by Lorrin Goodell M.D. on 06/07/2010 02:58:41 PM

## 2010-07-12 NOTE — Op Note (Signed)
NAME:  SAHITI, JOSWICK             ACCOUNT NO.:  [id number]   MEDICAL RECORD NO.:  [id number]          PATIENT TYPE:  AMB   LOCATION:  DAY                           FACILITY:  [facility]   PHYSICIAN:  Kassie Mends, M.D.      DATE OF BIRTH:  [birth year]   DATE OF PROCEDURE:  03/17/2008  DATE OF DISCHARGE:                               OPERATIVE REPORT   PROCEDURE:  Hydrogen breath test to evaluate for small bowel bacterial  overgrowth.   INDICATION FOR EXAM:  Ms. Carbon is a 60 year old female, who has a  history of Crohn colitis diagnosed at age 36.  She had a right  hemicolectomy at age 48.  She has been maintained on Pentasa.  She has  had multiple colonoscopies since age 89, which had shown quiescent  colitis and no evidence of active disease.  She complains of 6-7 bowel  movements a day and nausea.  She also complains of flatulence that is  foul smelling.   PRETEST CHECK:  Ms. Cantrelle denied having beans, bran, or high fiber  within 24 hours prior to the test.  She was n.p.o. since 5 p.m. on  March 15, 2008.  She denied smoking, sleep, or vigorous exercise 30  minutes prior to her test.  She denies recent antibiotic use.   TEST SUGAR:  Lactulose 37.5 g.   FINDINGS:  Initial analysis measured two parts per million.  She had a  peak of 12 parts per million at 16 minutes.  It was followed by a  measurement of 11 parts per million at 75 minutes.  The second peak  occurred at 120 minutes.  The breathalyzer read 26 parts per million.   ASSESSMENT:  Ms. Florentino has a hydrogen breath test which could be  consistent with mild small bowel bacterial overgrowth based on one  measurement of greater than 20 parts per million.  The differential  diagnosis includes that colonic gas caused a peak at 2 hours.   RECOMMENDATIONS:  1. Consider trial of antibiotics to address any component of small      bowel bacterial overgrowth.  Also, consider the addition of      probiotics daily.  2. She  already has a follow up appointment to see me in 3 months.       Kassie Mends, M.D.  Electronically Signed     SM/MEDQ  D:  03/17/2008  T:  03/18/2008  Job:  [id number]   cc:   Zachery Dauer, MD   Georgiana Spinner, M.D.  Fax: [phone]   Dr. Johny Chess

## 2010-07-12 NOTE — Op Note (Signed)
Stacy Elliott, Stacy Elliott             ACCOUNT NO.:  [id number]   MEDICAL RECORD NO.:  [id number]          PATIENT TYPE:  AMB   LOCATION:  ENDO                         FACILITY:  [facility]   PHYSICIAN:  Georgiana Spinner, M.D.    DATE OF BIRTH:  1950-11-29   DATE OF PROCEDURE:  DATE OF DISCHARGE:                               OPERATIVE REPORT   PROCEDURE:  Upper endoscopy.   INDICATIONS:  Significant indigestion.   ANESTHESIA:  Demerol 80 mg, Versed 10 mg and Phenergan 12.5 mg.   PROCEDURE:  With the patient mildly sedated in the left lateral  decubitus position, the Pentax videoscopic endoscope was inserted in the  mouth.  This was passed under direct vision through the esophagus, which  appeared normal, into the stomach, fundus, body, antrum, duodenal bulb  and second portion of the duodenum were visualized and all appeared  normal.  From this point, the endoscope was slowly withdrawn, taking  circumferential views of the duodenal mucosa until the endoscope had  been pulled back into the stomach and placed in retroflexion to view the  stomach from below.  The endoscope was then straightened and withdrawn,  taking circumferential views of remaining gastric and esophageal mucosa.  The patient's vital signs and pulse oximeter remained stable.  The  patient tolerated the procedure well without apparent complications.   FINDINGS:  This is a negative examination.   PLAN:  I will have patient followup with me as an outpatient.           ______________________________  Georgiana Spinner, M.D.     GMO/MEDQ  D:  02/06/2008  T:  02/06/2008  Job:  [id number]   cc:   Suzy Bouchard  Fax: [phone]

## 2010-07-12 NOTE — Op Note (Signed)
NAMESERAFINA, TOPHAM             ACCOUNT NO.:  [id number]   MEDICAL RECORD NO.:  [id number]          PATIENT TYPE:  AMB   LOCATION:  ENDO                         [facility]ACILITY:  [facility]   PHYSICIAN:  Georgiana Spinner, M.D.    DATE OF BIRTH:  05/18/1950   DATE OF PROCEDURE:  01/15/2007  DATE OF DISCHARGE:                               OPERATIVE REPORT   PROCEDURE:  Colonoscopy.   INDICATIONS:  Crohn colitis.   ANESTHESIA:  Fentanyl 125 mcg, Versed 12.5 mg, Phenergan 25 mg.   PROCEDURE:  With the patient mildly sedated in the left lateral  decubitus position, the Pentax videoscopic colonoscope was inserted into  the rectum and passed under direct vision to the neo-cecum, identified  by the end of the colon and surgical anastomosis.  The area which I  presume was small bowel was biopsied.  From this point the colonoscope  was slowly withdrawn taking circumferential views of colonic mucosa,  stopping in the descending colon, where three small polyps were seen.  Representative photographs were taken and all were removed using hot  biopsy forceps technique, setting of 20/150 blended current.  The  endoscope was then withdrawn all the way to the rectum which appeared  normal on direct.  I could not place the endoscope in the retroflexed  view so we elected to just withdraw the endoscope.  The patient's vital  signs and pulse oximetry remained stable.  The patient tolerated the  procedure well without apparent complications.   FINDINGS:  Three small polyps of the descending colon, otherwise an  unremarkable examination with no evidence of active colitis from rectum  to neo-cecum.   PLAN:  Await biopsy report.  Plan to proceed with small-bowel follow-  through and have patient follow up with me as an outpatient.           ______________________________  Georgiana Spinner, M.D.     GMO/MEDQ  D:  01/15/2007  T:  01/15/2007  Job:  [id number]   cc:   Suzy Bouchard  Fax: [phone]

## 2010-07-12 NOTE — Assessment & Plan Note (Signed)
NAME:  Stacy Elliott, Stacy Elliott              CHART#:  [id number]   DATE:  03/11/2008                       DOB:  May 14, 1950   PRIMARY PHYSICIAN:  Suzy Bouchard, MD; [facility], [address] [place], [place], [place] [address].  Fax number [phone], phone [phone]  [phone].   REASON FOR VISIT:  Crohn disease.   HISTORY OF PRESENT ILLNESS:  The patient is a 60 year old female who  reports being diagnosed with Crohn's colitis at age 75.  After 5 years  of her diagnosis, she had a right hemicolectomy at age 79.  She has been  maintained on Pentasa.  She wanted to establish care in [place]  because it was close to the [place].  She has had a barium enema in  2002, which showed gastroesophageal reflux disease and a hiatal hernia.  She began having EGDs and screening colonoscopies in August 2002  according to the computerized record.  In 2002, she had an inflammatory  polyp.  In 2004, she had inflammatory polyps on colonoscopy.  In 2005,  she had an EGD and a colonoscopy which showed quiescent colitis.  She  had a colonoscopy in November 2007 and November 2008.  November 2008,  the colonoscopy showed polypoid mucosa.  She also had a small bowel  follow-through in November 2008, which revealed no evidence of active  disease.  In December 2009, she had an upper endoscopy because she was  feeling nauseated and it was normal.  She has had previous EGD with  dilation (18-mm Savary) in January 2009.   She reports 6-7 bowel movements a day and sometimes 0 bowel movements a  day.  She is having no blood in her stool.  She has nausea all the  time.  She reports having ulcers in her esophagus. Although, the  reports in the computer showed no evidence of Crohn disease in her  esophagus from the biopsies.  She reports that she gets her  colonoscopies every 2 years.  Currently, she has been gaining weight  because she is really depressed.  Her husband died 3 years ago from  cancer.  He was  diagnosed and then died 3 months later.  She has seen  Dr. Patrecia Pace for thyroid problems.  Her blood pressure has been  uncontrolled up to 233/100 and she was started on Bystolic.  She  complains of having lots of gas.   PAST MEDICAL HISTORY:  1. Hypertension.  2. Anxiety.  3. Depression.  4. Hyperlipidemia.  5. Thyroid disease.  6. Asthma.  7. Gastroesophageal reflux disease.   PAST SURGICAL HISTORY:  1. Partial thyroidectomy due to a goiter.  2. Cholecystectomy in 2008 due to inflamed gallbladder.  3. Disk surgery.  4. Tonsillectomy.   ALLERGIES:  Penicillin, codeine, and fentanyl cause hives.  Sulfa causes  nausea and vomiting as well as codeine.  Fentanyl caused her throat to  close.   MEDICATIONS:  1. Bystolic 5 mg daily.  2. Hyzaar 100/12.5 mg daily.  3. Pravachol 80 mg daily.  4. Xanax 0.25 mg t.i.d.  5. Wellbutrin SR 200 mg daily.  6. Prozac 40 mg daily.  7. Femhrt daily.  8. Aspirin 81 mg daily.  9. Pentasa 3 t.i.d.  10.Advair b.i.d.  11.Nexium 40 mg b.i.d.  12.Phenergan as needed.  13.Calcium with vitamin D b.i.d.  FAMILY HISTORY:  She has no family history colon cancer or colon polyps.  She has 4 first cousins with Crohn disease.  She has no family history  breast, uterine, or ovarian cancer.   SOCIAL HISTORY:  She is to work Engineering geologist.  She gets her prescriptions  filled through a mail-order and needs 77-month supply.  She is widowed  and has 1 child age 58, living at home.  She denies any tobacco use and  does use alcohol.   REVIEW OF SYSTEMS:  As per the HPI, otherwise all systems are negative.   PHYSICAL EXAMINATION:  VITAL SIGNS:  Weight 180 pounds, height 5 feet,  BMI 35.2 (severely obese), temperature 97.9, blood pressure 132/88, and  pulse 64.GENERAL:  She is in no apparent distress.  Alert and oriented  x4.HEENT:  Atraumatic, normocephalic.  Pupils equal and reactive to  light.  Mouth, no oral lesions.  Posterior pharynx without erythema or   exudate.NECK:  Full range of motion.  No lymphadenopathy.LUNGS:  Clear  to auscultation bilaterally.CARDIOVASCULAR:  Regular rhythm.  No murmur.  Normal S1 and S2.ABDOMEN:  Bowel sounds are present, soft, nontender,  nondistended.  No rebound or guarding.  She has no hepatosplenomegaly.  EXTREMITIES:  No cyanosis or edema.SKIN:  No pretibial lesions.NEURO:  No focal neurologic deficit.   ASSESSMENT:  The patient is a 60 year old female whose Crohn disease  seems well controlled on her current Pentasa dose.  She does have  intermittent loose stools, nausea, and flatulence.  The differential  diagnosis includes small-bowel bacterial overgrowth or bile salt-induced  diarrhea.  Thank you for allowing me to see the patient in consultation.  My recommendations follow.   RECOMMENDATIONS:  1. She should continue her Pentasa and her Nexium.  She should follow      a low-fat diet.  She is given a handout on a low-fat diet.  2. She will be scheduled for hydrogen breath test to evaluate for      small-bowel bacterial overgrowth due to her history of right      hemicolectomy.  If she tries the low-fat diet and her symptoms do      not improve, then we would consider adding Colestid or      cholestyramine.  3. She has a follow up visit to see me in 3 months.  She would like      her next colonoscopy to be in November 2010.  4. She would also benefit from [facility] handout on gastroesophageal      reflux disease.       Kassie Mends, M.D.  Electronically Signed     SM/MEDQ  D:  03/11/2008  T:  03/11/2008  Job:  [id number]   cc:   Lorn Junes, M.D.  Alan Mulder, M.D.

## 2010-07-15 NOTE — Op Note (Signed)
NAME:  Stacy Elliott, Stacy Elliott                       ACCOUNT NO.:  [id number]   MEDICAL RECORD NO.:  [id number]                   PATIENT TYPE:  AMB   LOCATION:  ENDO                                 FACILITY:  [facility]   PHYSICIAN:  Georgiana Spinner, M.D.                 DATE OF BIRTH:  22-Jan-1951   DATE OF PROCEDURE:  08/07/2002  DATE OF DISCHARGE:                                 OPERATIVE REPORT   PROCEDURE:  Colonoscopy with biopsy and polypectomy.   ANESTHESIA:  Demerol 100 mcg, Versed 10 mg.   INDICATIONS FOR PROCEDURE:  History of Crohn's disease, see attached  clinical notes for further details from recent office visit.   DESCRIPTION OF PROCEDURE:  With the patient mildly sedated in the left  lateral decubitus position, the Olympus videoscopic colonoscope was inserted  into the rectum, passed under direct vision to the neo-cecum, which was  photographed.  We entered in Elliott short distance to what appeared to be small  bowel and took biopsies of this area. From this point, the colonoscope was  then slowly withdrawn taking circumferential views of the colonic mucosa  visualized, stopping in various places where numerous polyps were seen  throughout the colon, mostly on the right side and these were felt by me  probably to be inflammatory polyps and that they appeared to have pustular  heads.  These were photographed in various places and multiple biopsies were  taken, both with cold biopsy forceps, hot biopsy forceps, and with snare  technique.  In the left colon, rectum, there appeared to be normal mucosa  and the endoscope in the rectum was placed on retroflexion to view the anal  canal from above which appeared normal. The endoscope was straightened and  withdrawn. The patient's vital signs and pulse oximetry remained stable. The  patient tolerated the procedure well without apparently complications.   FINDINGS:  Numerous polyps, mostly on the right side of the colon which were  felt  to be probably inflammatory. Multiple polyps were sampled and biopsies  were taken of what appeared to be small bowel intestine which, although only  Elliott small amount was seen, did not appear to be inflamed either.   PLAN:  Await biopsy report.  Proceed to small bowel series and have the  patient follow up with me for the results of the biopsies, small bowel  series, and other labs that have been previously done.                                               Georgiana Spinner, M.D.    GMO/MEDQ  D:  08/07/2002  T:  08/07/2002  Job:  [id number]   cc:   Delaney Meigs, M.D.  [address]  [place]  [place] [address]  Fax: [phone]

## 2010-07-15 NOTE — Procedures (Signed)
[place]  Patient:    Stacy Elliott, Stacy Elliott                    MRN: [id number] Adm. Date:  [id number] Attending:  Sabino Gasser CC:         Dr. Alyson Reedy, [place]   Procedure Report  PROCEDURE:  Upper endoscopy.  ANESTHESIA:  Demerol 80 mg, Versed 8 mg.  INDICATIONS:  See attached clinical note from 09/14/00.  The patient with a history of Crohns disease, possibly associated with irritable bowel syndrome and abdominal pain.  DESCRIPTION OF PROCEDURE:  With the patient mildly sedated in the left lateral decubitus position, the Olympus videoscopic endoscope was inserted into the mouth, passed under direct vision through the esophagus which appeared normal. There was no evidence of Barretts esophagus.  We entered into the stomach. The fundus, body, antrum, duodenal bulb, second portion of the duodenum were well visualized.  The duodenum appeared normal.  From this point, the endoscope was slowly withdrawn taking circumferential views of the entire duodenal mucosa through the endoscope and pulled back into the stomach, placed in retroflexion and viewed the stomach from below, and this appeared normal. There was no hiatal hernia seen on this examination, and it was photographed. The endoscope was then straightened and withdrawn taking circumferential views in the entire gastric and esophageal mucosa.  The stomach showed diffuse erythema consistent with a gastritis.  This was photographed and biopsied, and the endoscope was withdrawn.  The patients vital signs and pulse oximeter remained stable.  The patient tolerated the procedure well with no apparent complications.  FINDINGS:  Diffuse erythema of stomach, probable H. pylori gastritis, await biopsy report.  The patient will call me for results and follow up with me as an outpatient.  Proceed to colonoscopy as planned. DD:  10/03/00 TD:  10/03/00 Job: [id number]

## 2010-07-15 NOTE — Op Note (Signed)
NAME:  Stacy Elliott, Stacy Elliott                       ACCOUNT NO.:  [id number]   MEDICAL RECORD NO.:  [id number]                   PATIENT TYPE:  AMB   LOCATION:  DFTL                                 FACILITY:  [facility]   PHYSICIAN:  Georgiana Spinner, M.D.                 DATE OF BIRTH:  May 11, 1950   DATE OF PROCEDURE:  09/24/2003  DATE OF DISCHARGE:                                 OPERATIVE REPORT   PROCEDURE:  Upper endoscopy with biopsy.   INDICATIONS:  GERD, Crohn's disease.   ANESTHESIA:  Demerol 80 mg, Versed 8 mg, also Phenergan 25 mg.   PROCEDURE:  With the patient mildly sedated in the left lateral decubitus  position, the Olympus videoscopic endoscope was inserted into the mouth,  passed under direct vision through the esophagus, which appeared normal  except for Elliott question of short-segment Barrett's, which was photographed and  biopsied subsequently.  We entered into the stomach.  Fundus, body, antrum,  duodenal bulb, second portion of the duodenum all appeared normal.  From  this point the endoscope was slowly withdrawn taking circumferential views  of the small bowel mucosa until the endoscope had been pulled back into the  stomach, placed in retroflexion to view the stomach from below.  The  endoscope was straightened and withdrawn taking circumferential views of the  remaining gastric and esophageal mucosa.  The patient's vital signs and  pulse oximetry remained stable without apparent complications.   FINDINGS:  __________ changes at the squamocolumnar junction.   Await biopsy report.  The patient will call me for results and follow up  with me as an outpatient.  Proceed to colonoscopy as planned.                                               Georgiana Spinner, M.D.    GMO/MEDQ  D:  09/24/2003  T:  09/24/2003  Job:  [id number]   cc:   Dr. Kandice Robinsons, [place]

## 2010-07-15 NOTE — Op Note (Signed)
NAMEJAMIELYNN, Stacy Elliott             ACCOUNT NO.:  [id number]   MEDICAL RECORD NO.:  [id number]          PATIENT TYPE:  AMB   LOCATION:  ENDO                         FACILITY:  [facility]   PHYSICIAN:  Georgiana Spinner, M.D.    DATE OF BIRTH:  23-Sep-1950   DATE OF PROCEDURE:  03/21/2006  DATE OF DISCHARGE:                               OPERATIVE REPORT   PROCEDURE:  Upper endoscopy with Savary dilation.   INDICATIONS:  Dysphagia.   ANESTHESIA:  Demerol 100 mg, Versed 8 mg, Phenergan 12.5 mg.   PROCEDURE:  With the patient mildly sedated in the left lateral  decubitus position, the Pentax videoscopic endoscope was inserted in the  mouth and passed under direct vision through the esophagus which  appeared normal until we reached distal esophagus and there was a slight  stricture noted. We entered into the stomach. Fundus, body, antrum,  duodenal bulb, second portion duodenum appeared normal.  From this point  the endoscope was slowly withdrawn taking circumferential views of  duodenal mucosa until the endoscope had been pulled back into the  stomach, placed in retroflexion to view the stomach from below. The  endoscope was then straightened and a guidewire was passed. The  endoscope was withdrawn.  Subsequently Savary dilator 18 mm was passed  rather easily. With its passage under fluoroscopic control once we had  dilated the distal esophagus.  The endoscope, the guidewire and the  dilator were removed. The endoscope was reinserted back into the  stomach.  The endoscope was then withdrawn taking circumferential views  of the remaining esophageal mucosa.  The patient's vital signs, pulse  oximeter remained stable.  The patient tolerated procedure well without  apparent complications.   FINDINGS:  Dilation of distal esophagus to the 18 mm by Savary  technique.   PLAN:  Await clinical response.  The patient will follow-up with me as  needed as an outpatient.     ______________________________  Georgiana Spinner, M.D.     GMO/MEDQ  D:  03/21/2006  T:  03/21/2006  Job:  [id number]   cc:   Suzy Bouchard

## 2010-07-15 NOTE — Op Note (Signed)
NAME:  Stacy Elliott, Stacy Elliott                       ACCOUNT NO.:  [id number]   MEDICAL RECORD NO.:  [id number]                   PATIENT TYPE:  AMB   LOCATION:  DFTL                                 FACILITY:  [facility]   PHYSICIAN:  Georgiana Spinner, M.D.                 DATE OF BIRTH:  February 01, 1951   DATE OF PROCEDURE:  09/24/2003  DATE OF DISCHARGE:                                 OPERATIVE REPORT   PROCEDURE:  Colonoscopy.   INDICATIONS FOR PROCEDURE:  Colon cancer screening.   ANESTHESIA:  Demerol 20, Versed 2 mg.   DESCRIPTION OF PROCEDURE:  With the patient mildly sedated in the left  lateral decubitus position, the Olympus videoscopic colonoscope was inserted  in the rectum, passed under direct vision to the small bowel which was  photographed.  From this point, the colonoscope was slowly withdrawn taking  circumferential views of the colonic mucosa, stopping to take random  biopsies along the way in the right and left colon and also to biopsy  specifically in the left colon numerous pseudopolyps, some of which were  photographed and most were biopsied.  The endoscope was withdrawn all the  way to the rectum as noted which appeared normal on direct and showed  hemorrhoids in retroflexed view.  The endoscope was straightened and  withdrawn.  The patient's vital signs and pulse oximetry remained stable.  The patient tolerated the procedure well without apparent complications.   FINDINGS:  1. 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  2. Internal hemorrhoids.  3. Random biopsies taken.   PLAN:  Await biopsy report.  The patient will call me for results and follow  up with me as an outpatient.                                               Georgiana Spinner, M.D.    GMO/MEDQ  D:  09/24/2003  T:  09/24/2003  Job:  [id number]   cc:   Suzy Bouchard  [address] [place]  [place] [address]  Fax: [phone]

## 2010-07-15 NOTE — Op Note (Signed)
NAMEMARSHAY, SLATES             ACCOUNT NO.:  [id number]   MEDICAL RECORD NO.:  [id number]          PATIENT TYPE:  AMB   LOCATION:  ENDO                         FACILITY:  [facility]   PHYSICIAN:  Georgiana Spinner, M.D.    DATE OF BIRTH:  May 05, 1950   DATE OF PROCEDURE:  DATE OF DISCHARGE:                                 OPERATIVE REPORT   PROCEDURE:  Colonoscopy.   INDICATIONS:  Crohn's disease.   ANESTHESIA:  Demerol 130 mg, Versed 10 mg and Phenergan 12.5 mg.   DESCRIPTION OF PROCEDURE:  With the patient mildly sedated in the lateral  decubitus position, the Olympus videoscopic colonoscope was inserted and  passed under direct vision to the __________  and small bowel, all of which  appeared generally normal.  Small bowel photographs and biopsies were taken.  At this point, the colonoscope was withdrawn, taking circumferential views  of the colonic mucosa and the small bowel mucosa that was visualized until  we reached the rectum which appeared normal on direct and showed hemorrhoids  on retroflex view.  The endoscope was straightened and withdrawn.  The  patient's vital signs and pulse oximeter remained stable.  The patient  tolerated the procedure well without apparent complication.   FINDINGS:  Rather unremarkable examination other than surgical absence of  the cecum.   PLAN:  Await biopsy report.  The patient will follow up with me as an  outpatient.           ______________________________  Georgiana Spinner, M.D.     GMO/MEDQ  D:  03/02/2005  T:  03/02/2005  Job:  [id number]

## 2010-07-15 NOTE — Procedures (Signed)
[place]. [facility]  Patient:    Stacy Elliott, Stacy Elliott                    MRN: [id number] Adm. Date:  [id number] Attending:  Sabino Gasser CC:         Dr. Alyson Reedy, [place]   Procedure Report  PROCEDURE:  Colonoscopy.  INDICATIONS:  Crohns disease.  DESCRIPTION OF PROCEDURE:  With the patient mildly sedated in the left lateral decubitus position, subsequently rolled to her back, the Olympus videoscopic colonoscope was inserted in the rectum and passed under direct vision to the neocecum, identified by a change to small bowel mucosa distinctly seen.  This was photographed.  The terminal ileal area is biopsied, and in the neocecum, there were multiple small ulcerations on the top of what appeared to be pseudopolyps.  These were photographed as well, and multiple biopsies of these were taken.  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  We then slowly withdrew the colonoscopy taking circumferential views of the entire colonic mucosa stopping only in the various areas of the colon to biopsy what appeared to be pseudopolyps with ulcerated tops, and this was done until we reached the rectum at which point in retroflexed view of the rectum, there was a question of a true polyp.  This was photographed and in retroflexed view, this was biopsied only.  The endoscope was then straightened and withdrawn.  The patients vital signs and pulse oximeter remained stable. The patient tolerated the procedure well without apparent complications.  FINDINGS:  Pseudopolyps seen throughout the colon, melanosis coli, question of a true polyp in the rectum.  Await biopsy report.  The patient will call me for results.  We will talk to her about her laxative use and abuse and try to treat her symptoms accordingly, and also, she will follow up with the results of the biopsies. DD:  10/03/00 TD:  10/03/00 Job: [id number]

## 2010-08-18 ENCOUNTER — Other Ambulatory Visit: Payer: Self-pay

## 2010-08-18 MED ORDER — MESALAMINE ER 250 MG PO CPCR: 750.0000 mg | ORAL_CAPSULE | Freq: Three times a day (TID) | ORAL | Status: DC

## 2010-08-18 NOTE — Telephone Encounter (Signed)
Pt called- she needs her Pentasa refilled. [facility] fax number [phone]

## 2010-08-18 NOTE — Telephone Encounter (Signed)
done

## 2010-10-04 ENCOUNTER — Other Ambulatory Visit: Payer: Self-pay

## 2010-10-04 ENCOUNTER — Other Ambulatory Visit: Payer: Self-pay | Admitting: Gastroenterology

## 2010-10-04 DIAGNOSIS — R1310 Dysphagia, unspecified: Secondary | ICD-10-CM

## 2010-10-04 MED ORDER — PROMETHAZINE HCL 25 MG PO TABS: 25.0000 mg | ORAL_TABLET | Freq: Four times a day (QID) | ORAL | Status: DC | PRN

## 2010-10-04 NOTE — Progress Notes (Signed)
Opened in error

## 2010-10-05 NOTE — Telephone Encounter (Signed)
[facility] in [place] informed.

## 2010-10-26 ENCOUNTER — Ambulatory Visit (INDEPENDENT_AMBULATORY_CARE_PROVIDER_SITE_OTHER): Payer: Medicare Other | Admitting: Gastroenterology

## 2010-10-26 ENCOUNTER — Encounter: Payer: Self-pay | Admitting: Gastroenterology

## 2010-10-26 VITALS — BP 144/80 | HR 62 | Temp 97.7°F | Ht 60.0 in | Wt 130.0 lb

## 2010-10-26 DIAGNOSIS — K219 Gastro-esophageal reflux disease without esophagitis: Secondary | ICD-10-CM

## 2010-10-26 MED ORDER — ESOMEPRAZOLE MAGNESIUM 40 MG PO CPDR: DELAYED_RELEASE_CAPSULE | ORAL | Status: DC

## 2010-10-26 MED ORDER — PROMETHAZINE HCL 25 MG PO TABS: ORAL_TABLET | ORAL | Status: DC

## 2010-10-26 NOTE — Progress Notes (Signed)
Subjective:    Patient ID: Stacy Elliott, female    DOB: [birth year], 60 y.o.   MRN: [id number]  PCP: Dorna Leitz, MD  HPI Medicine have quit in her. Dexilant not working. Can have a problem with med effects wearing off and would like to try another. CAN GET CHOKED REAL EASY. EGD/DIL MAR 2012-helped a little. Rare abd pain. BMs: can have diarrhea all day long and the next few no BM. Sometimes wears a depends to sleep in to prevent accidents. Tanning bed helps sciatic nerve. Was going a couple times a week. Intentional 70 lbs weight loss.  Past Medical History  Diagnosis Date  . Mouth sores   . Dysphagia 2002 BASW-SML HH/GERD  . GERD (gastroesophageal reflux disease)   . Anxiety disorder   . Depression   . Hyperlipemia   . Hypertension   . Hypothyroidism   . Ileitis, terminal 1993  . History of MRSA infection 2011 NOSE  . Obesity (BMI 30-39.9) 2011 200 LBS    MAR 2012 147 LBS  . Diarrhea INTERMITTENT    JAN 2010 HBT SIBO (1pk 26 ppm), ABX-JAN & MAY 2010  . Inflammatory bowel disease 1993 ?CHRON'S ILEITIS    PATH: NO GRANULOMAS OR CRYPT DISTORTION, NL COLON  . Irritable bowel syndrome DIARRHEA  . Asthma     Past Surgical History  Procedure Date  . Esophagogastroduodenoscopy 2008 DIL DR. ORR    2010 DIL 17 MM SAV-->EGD/DIL RMR MAR 2012  . Back surgery   . Cholecystectomy   . Thyroid surgery   . Tonsillectomy   . Colon resection 1993 RIGHT    TERMINAL ILEITIS, NO GRANULOMAS/CRYPT DISTORTION  . 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 Colonoscopy NOV 2011     ILEOTCS-NL  neo-TI & COLON, Bx: nl q10 cm  . Colonoscopy NOV 2010 ?IBD> 10 YEARS    ILEOTCS-NL  neo-TI & COLON, Bx: nl q10 cm    Allergies  Allergen Reactions  . Codeine     REACTION: hives  . Fentanyl     REACTION: hives / closing of throat  . Penicillins     REACTION: hives  . Sulfonamide Derivatives     REACTION: N/V    Current Outpatient Prescriptions    Medication Sig Dispense Refill  . ALPRAZolam (XANAX) 0.25 MG tablet Take 0.25 mg by mouth 3 (three) times daily.        Marland Kitchen aspirin 81 MG tablet Take 81 mg by mouth daily.        Marland Kitchen buPROPion (WELLBUTRIN SR) 200 MG 12 hr tablet Take 200 mg by mouth daily.        . Calcium Carbonate-Vitamin D (CALCIUM-VITAMIN D) 500-200 MG-UNIT per tablet Take 1 tablet by mouth daily.        . fexofenadine (ALLEGRA) 180 MG tablet Take 180 mg by mouth daily.        Marland Kitchen FLUoxetine (PROZAC) 20 MG tablet Take 20 mg by mouth 3 (three) times daily.        Marland Kitchen JINTELI 1-5 MG-MCG TABS       . losartan-hydrochlorothiazide (HYZAAR) 100-12.5 MG per tablet Take 1 tablet by mouth daily.        . mesalamine (PENTASA) 250 MG CR capsule Take 3 capsules (750 mg total) by mouth 3 (three) times daily.    . montelukast (SINGULAIR) 10 MG tablet Take 10 mg by mouth at bedtime.      . nebivolol (BYSTOLIC) 5 MG  tablet Take 5 mg by mouth 2 (two) times daily.      . pravastatin (PRAVACHOL) 80 MG tablet     . sucralfate (CARAFATE) 1 GM/10ML suspension Take 1 g by mouth 3 (three) times daily as needed.      Marland Kitchen esomeprazole (NEXIUM) 40 MG capsule 1 po 30 minutes prior to meals twice daily    . pantoprazole (PROTONIX) 40 MG tablet Take 40 mg by mouth. 1 by mouth prior to meals 2x a day     . promethazine (PHENERGAN) 25 MG tablet 1/2 to 1 po q4-6h prn for nausea or vomiting        Review of Systems     Objective:   Physical Exam  Constitutional: She is oriented to person, place, and time. She appears well-developed and well-nourished. No distress.  HENT:  Head: Normocephalic and atraumatic.  Cardiovascular: Normal rate, regular rhythm and normal heart sounds.   Pulmonary/Chest: Effort normal and breath sounds normal.  Abdominal: Soft. Bowel sounds are normal. She exhibits no distension. There is no tenderness.  Neurological: She is alert and oriented to person, place, and time.  Psychiatric: She has a normal mood and affect.           Assessment & Plan:

## 2010-10-26 NOTE — Progress Notes (Signed)
Pt is aware of OV for 11/1 @ 0900 with SF E30 visit

## 2010-10-26 NOTE — Progress Notes (Signed)
Cc to PCP 

## 2010-10-27 ENCOUNTER — Encounter: Payer: Self-pay | Admitting: Gastroenterology

## 2010-10-27 NOTE — Assessment & Plan Note (Signed)
Sx not controled with Dexilant.  Change to Protonix BID for 3 mos. OPV in 3 mos.

## 2010-11-08 ENCOUNTER — Telehealth: Payer: Self-pay

## 2010-11-08 NOTE — Telephone Encounter (Signed)
VM from [place] @ [employer]. Nexium not covered on insurance. Either Omeprazole or Pantoprazole would be covered. Faxed over a paper on 10/27/2010 with that info. Call back number is [phone] reference number is [id number].

## 2010-11-09 NOTE — Telephone Encounter (Signed)
This PA was done and approved on 11/02/10. [facility] and they said they would fix it.

## 2010-12-19 ENCOUNTER — Telehealth: Payer: Self-pay | Admitting: Gastroenterology

## 2010-12-19 NOTE — Telephone Encounter (Signed)
Has ulcers in her mouth an she is saying its coming from her stomach being out of wack, she is asking for Reglan to get her self straightened out and she uses [facility] in [place] Please advise??

## 2010-12-19 NOTE — Telephone Encounter (Signed)
Called pt. She said that she has a rotating problem with diarrhea and constipation. She is having constipation now. Has not had a BM in 4 days. When she does take something for it, she takes Ducolax. But she said it ends up making her sick on her stomach. She has ulcers in her mouth now, that is the reason she suggested Reglan.She said the top of her mouth is covered with ulcers, and she has a couple on her lip.She keeps magic mouth wash and is using that now. She has taken Reglan before.She takes Nexium twice daily now and also has to take Mylanta some also. She is aware that Dr. Darrick Penna is out of town for a few days. Please advise!

## 2010-12-19 NOTE — Telephone Encounter (Signed)
Continue Magic Mouth Wash,  1-2 tsp q1-2 as needed mouth pain, swish and spit. Make sure she is taking a probiotic. May offer her #14 samples of restora or align if needed. She may take Miralax 1 capful as needed for bowel movement daily. If she needs to, may take X 2 today. Avoid if diarrhea.  No Reglan.

## 2010-12-19 NOTE — Telephone Encounter (Signed)
Pt informed. Said she is taking a probiotic.

## 2010-12-26 NOTE — Telephone Encounter (Signed)
AGREE. PT LIKELY HAS APTHOUS ULCERS. SUPPORTIVE CARE.

## 2010-12-29 ENCOUNTER — Ambulatory Visit (INDEPENDENT_AMBULATORY_CARE_PROVIDER_SITE_OTHER): Payer: Medicare Other | Admitting: Gastroenterology

## 2010-12-29 ENCOUNTER — Encounter: Payer: Self-pay | Admitting: Gastroenterology

## 2010-12-29 ENCOUNTER — Other Ambulatory Visit: Payer: Self-pay | Admitting: Gastroenterology

## 2010-12-29 VITALS — BP 109/67 | HR 69 | Temp 97.6°F | Ht 60.0 in | Wt 130.6 lb

## 2010-12-29 DIAGNOSIS — K219 Gastro-esophageal reflux disease without esophagitis: Secondary | ICD-10-CM

## 2010-12-29 DIAGNOSIS — R110 Nausea: Secondary | ICD-10-CM

## 2010-12-29 NOTE — Progress Notes (Signed)
Subjective:    Patient ID: Stacy Elliott, female    DOB: 05-03-1950, 60 y.o.   MRN: [id number]  PCP: [facility]  HPI Pt has uncontrolled reflux for the past 10 years. Tried on most recently Dexilant and it failed, as well as Protonix, Prilosec, Prevacid. Nexium works 50% of the time. Last BPE: 2011-nl esophagus. Has had 5-6 EGDs for indigestion and dysphagia since 2002. Swallowing improved after 75% after EGD/dil. Has nausea everyday and use Phenergan. No vomiting. Bms: one day goes and may have no BM for one week. Can have diarrhea all day: yellow. No blood in stool or tarry stools. Pain: hurts all the time, all her life-pain moves around not in one spot. Reflux Sx: burning and tongue stays sore. Went to Dentist 4 mos ago and he said the "acid is wearing on her teeth enamel". Recommended MOM rinse prn when tongue is sore. Never had reflux surgery.  Past Medical History  Diagnosis Date  . Mouth sores   . 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 GERD (gastroesophageal reflux disease)   . Anxiety disorder   . Depression   . Hyperlipemia   . Hypertension   . Hypothyroidism   . Ileitis, terminal 1993  . History of MRSA infection 2011 NOSE  . Obesity (BMI 30-39.9) 2011 200 LBS    MAR 2012 147 LBS  . Diarrhea INTERMITTENT    JAN 2010 HBT SIBO (1pk 26 ppm), ABX-JAN & MAY 2010  . Inflammatory bowel disease 1993 ?CHRON'S ILEITIS    PATH: NO GRANULOMAS OR CRYPT DISTORTION, NL COLON  . Irritable bowel syndrome DIARRHEA  . Asthma     Past Surgical History  Procedure Date  . Esophagogastroduodenoscopy 2008 DIL DR. ORR    2010 DIL 17 MM SAV-->EGD/DIL RMR MAR 2012  . Back surgery   . Cholecystectomy   . Thyroid surgery   . Tonsillectomy   . Colon resection 1993 RIGHT    TERMINAL ILEITIS, NO GRANULOMAS/CRYPT DISTORTION  . 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 Colonoscopy NOV 2011     ILEOTCS-NL  neo-TI & COLON,  Bx: nl q10 cm  . Colonoscopy NOV 2010 ?IBD> 10 YEARS    ILEOTCS-NL  neo-TI & COLON, Bx: nl q10 cm   Allergies  Allergen Reactions  . Codeine     REACTION: hives  . Fentanyl     REACTION: hives / closing of throat  . Penicillins     REACTION: hives  . Sulfonamide Derivatives     REACTION: N/V    Current Outpatient Prescriptions  Medication Sig Dispense Refill  . ALPRAZolam (XANAX) 0.5 MG tablet Take 0.5 mg by mouth at bedtime as needed.       Marland Kitchen aspirin 81 MG tablet Take 81 mg by mouth daily.        Marland Kitchen buPROPion (WELLBUTRIN XL) 300 MG 24 hr tablet       . carvedilol (COREG) 12.5 MG tablet Take 12.5 mg by mouth 2 (two) times daily with a meal.        . esomeprazole (NEXIUM) 40 MG capsule 1 po 30 minutes prior to meals twice daily    . fexofenadine (ALLEGRA) 180 MG tablet Take 180 mg by mouth daily.      Marland Kitchen FLUoxetine (PROZAC) 20 MG tablet Take 20 mg by mouth 3 (three) times daily.     Marland Kitchen JINTELI 1-5 MG-MCG TABS 1 tablet daily.     Marland Kitchen  losartan-hydrochlorothiazide (HYZAAR) 100-12.5 MG per tablet Take 1 tablet by mouth daily.     . mesalamine (PENTASA) 250 MG CR capsule Take 3 capsules (750 mg total) by mouth 3 (three) times daily.    . montelukast (SINGULAIR) 10 MG tablet Take 10 mg by mouth at bedtime.      . nebivolol (BYSTOLIC) 5 MG tablet Take 5 mg by mouth 2 (two) times daily.      . pravastatin (PRAVACHOL) 80 MG tablet Take 80 mg by mouth daily.     . promethazine (PHENERGAN) 25 MG tablet 1/2 to 1 po q4-6h prn for nausea or vomiting    . sucralfate (CARAFATE) 1 GM/10ML suspension Take 1 g by mouth 3 (three) times daily as needed.      . ALPRAZolam (XANAX) 0.25 MG tablet Take 0.25 mg by mouth 3 (three) times daily.                Review of Systems  All other systems reviewed and are negative.       Objective:   Physical Exam  Vitals reviewed. Constitutional: She is oriented to person, place, and time. She appears well-developed and well-nourished. No distress.  HENT:    Head: Normocephalic and atraumatic.  Mouth/Throat: Oropharynx is clear and moist. No oropharyngeal exudate.  Eyes: Pupils are equal, round, and reactive to light. No scleral icterus.  Neck: Normal range of motion. Neck supple.  Cardiovascular: Normal rate, regular rhythm and normal heart sounds.   Pulmonary/Chest: Effort normal and breath sounds normal.  Abdominal: Soft. Bowel sounds are normal. She exhibits no distension. There is no tenderness.  Musculoskeletal: She exhibits no edema.  Neurological: She is alert and oriented to person, place, and time.       NO FOCAL DEFICITS   Skin: Skin is warm and dry.  Psychiatric: She has a normal mood and affect.          Assessment & Plan:

## 2010-12-29 NOTE — Progress Notes (Signed)
Cc to PCP 

## 2010-12-29 NOTE — Assessment & Plan Note (Signed)
Sx uncontrolled on Dexilant pt now back on Nexium BID. Differential diagnosis includes uncontrolled acid reflux exacerbated by gastroparesis , uncontrolled non-acid reflux, or non-ulcer dyspepsia.  GES WITHIN THE NEXT WEEK. EGD W/ PROPOFOL DUE TO POLYPHARMACY FOR BRAVO CAPSULE PLACEMENT. WILL DECIDE IF PT NEED REFLUX SURGERY, BACLOFEN, OR INCREASED SSRI OR TCA AFTER EGD. Pt voiced understanding. CONTINUE NEXIUM. OPV IN 3 MOS E: 30 VISIT.

## 2010-12-29 NOTE — Progress Notes (Signed)
Reminder in epic to follow up in 3 months with SF in E30 visit °

## 2011-01-02 ENCOUNTER — Telehealth: Payer: Self-pay | Admitting: Gastroenterology

## 2011-01-02 ENCOUNTER — Other Ambulatory Visit: Payer: Self-pay | Admitting: Gastroenterology

## 2011-01-02 ENCOUNTER — Ambulatory Visit (HOSPITAL_COMMUNITY)
Admission: RE | Admit: 2011-01-02 | Discharge: 2011-01-02 | Disposition: A | Discharge status: Home / Self Care | Payer: Medicare Other | Source: Ambulatory Visit | Attending: Gastroenterology | Admitting: Gastroenterology

## 2011-01-02 DIAGNOSIS — K219 Gastro-esophageal reflux disease without esophagitis: Secondary | ICD-10-CM

## 2011-01-02 DIAGNOSIS — R110 Nausea: Secondary | ICD-10-CM | POA: Insufficient documentation

## 2011-01-02 MED ORDER — TECHNETIUM TC 99M SULFUR COLLOID: 2.0000 | Freq: Once | INTRAVENOUS | Status: AC | PRN

## 2011-01-02 NOTE — Telephone Encounter (Signed)
Results Cc to PCP  

## 2011-01-02 NOTE — Telephone Encounter (Signed)
Pt informed

## 2011-01-02 NOTE — Telephone Encounter (Signed)
PLEASE CALL PT. HER GES STUDY IS NORMAL.

## 2011-01-03 ENCOUNTER — Encounter: Payer: Self-pay | Admitting: Gastroenterology

## 2011-01-04 ENCOUNTER — Encounter (HOSPITAL_COMMUNITY): Payer: Self-pay

## 2011-01-04 ENCOUNTER — Other Ambulatory Visit: Payer: Self-pay

## 2011-01-04 ENCOUNTER — Encounter (HOSPITAL_COMMUNITY)
Admission: RE | Admit: 2011-01-04 | Discharge: 2011-01-04 | Disposition: A | Discharge status: Home / Self Care | Payer: Medicare Other | Source: Ambulatory Visit | Attending: Gastroenterology | Admitting: Gastroenterology

## 2011-01-04 ENCOUNTER — Encounter (HOSPITAL_COMMUNITY): Payer: Self-pay | Admitting: Pharmacy Technician

## 2011-01-04 HISTORY — DX: Nausea with vomiting, unspecified: R11.2

## 2011-01-04 HISTORY — DX: Unspecified osteoarthritis, unspecified site: M19.90

## 2011-01-04 HISTORY — DX: Diaphragmatic hernia without obstruction or gangrene: K44.9

## 2011-01-04 HISTORY — DX: Other specified postprocedural states: Z98.890

## 2011-01-04 LAB — BASIC METABOLIC PANEL
BUN: 13 mg/dL (ref 6–23)
Calcium: 9.5 mg/dL (ref 8.4–10.5)
Creatinine, Ser: 0.8 mg/dL (ref 0.50–1.10)
GFR calc non Af Amer: 79 mL/min — ABNORMAL LOW (ref >90)
Glucose, Bld: 72 mg/dL (ref 70–99)

## 2011-01-04 LAB — CBC
HCT: 37.1 % (ref 36.0–46.0)
Hemoglobin: 12.6 g/dL (ref 12.0–15.0)
MCH: 31.5 pg (ref 26.0–34.0)
MCHC: 34 g/dL (ref 30.0–36.0)

## 2011-01-04 NOTE — Patient Instructions (Addendum)
21 Stacy Elliott  01/04/2011   Your procedure is scheduled on:  01/09/2011  Report to [facility] at  900  AM.  Call this number if you have problems the morning of surgery: [phone]   Remember:   Do not eat food:After Midnight.  Do not drink clear liquids: After Midnight.  Take these medicines the morning of surgery with A SIP OF WATER: xanax,coreg,wellbutrin,nexium,allegra,prozac,losartan,singulair,phenergan   Do not wear jewelry, make-up or nail polish.  Do not wear lotions, powders, or perfumes. You may wear deodorant.  Do not shave 48 hours prior to surgery.  Do not bring valuables to the hospital.  Contacts, dentures or bridgework may not be worn into surgery.  Leave suitcase in the car. After surgery it may be brought to your room.  For patients admitted to the hospital, checkout time is 11:00 AM the day of discharge.   Patients discharged the day of surgery will not be allowed to drive home.  Name and phone number of your driver: family Special Instructions: N/A   Please read over the following fact sheets that you were given: Pain Booklet, Surgical Site Infection Prevention, Anesthesia Post-op Instructions and Care and Recovery After Surgery Esophagogastroduodenoscopy This is an endoscopic procedure (a procedure that uses a device like a flexible telescope) that allows your caregiver to view the upper stomach and small bowel. This test allows your caregiver to look at the esophagus. The esophagus carries food from your mouth to your stomach. They can also look at your duodenum. This is the first part of the small intestine that attaches to the stomach. This test is used to detect problems in the bowel such as ulcers and inflammation. PREPARATION FOR TEST Nothing to eat after midnight the day before the test. NORMAL FINDINGS Normal esophagus, stomach, and duodenum. Ranges for normal findings may vary among different laboratories and hospitals. You should always check with your  doctor after having lab work or other tests done to discuss the meaning of your test results and whether your values are considered within normal limits. MEANING OF TEST  Your caregiver will go over the test results with you and discuss the importance and meaning of your results, as well as treatment options and the need for additional tests if necessary. OBTAINING THE TEST RESULTS It is your responsibility to obtain your test results. Ask the lab or department performing the test when and how you will get your results. Document Released: 06/16/2004 Document Revised: 10/26/2010 Document Reviewed: 01/24/2008 [employer] Patient Information 2012 [place], [place].PATIENT INSTRUCTIONS POST-ANESTHESIA  IMMEDIATELY FOLLOWING SURGERY:  Do not drive or operate machinery for the first twenty four hours after surgery.  Do not make any important decisions for twenty four hours after surgery or while taking narcotic pain medications or sedatives.  If you develop intractable nausea and vomiting or a severe headache please notify your doctor immediately.  FOLLOW-UP:  Please make an appointment with your surgeon as instructed. You do not need to follow up with anesthesia unless specifically instructed to do so.  WOUND CARE INSTRUCTIONS (if applicable):  Keep a dry clean dressing on the anesthesia/puncture wound site if there is drainage.  Once the wound has quit draining you may leave it open to air.  Generally you should leave the bandage intact for twenty four hours unless there is drainage.  If the epidural site drains for more than 36-48 hours please call the anesthesia department.  QUESTIONS?:  Please feel free to call your physician or the hospital  operator if you have any questions, and they will be happy to assist you.     [facility] Anesthesia Department [address] [place] [phone]

## 2011-01-05 NOTE — Pre-Procedure Instructions (Signed)
Sodium of 125 shown to Dr Jayme Cloud. No orders given.

## 2011-01-06 NOTE — OR Nursing (Signed)
Sodium  level 125 reported to Dr. Tollie Eth. Per  Dr. Tollie Eth show labs to Dr. Jayme Cloud Monday.

## 2011-01-09 ENCOUNTER — Encounter (HOSPITAL_COMMUNITY)
Admission: RE | Disposition: A | Discharge status: Home / Self Care | Payer: Self-pay | Source: Ambulatory Visit | Attending: Gastroenterology

## 2011-01-09 ENCOUNTER — Encounter (HOSPITAL_COMMUNITY): Payer: Self-pay | Admitting: Anesthesiology

## 2011-01-09 ENCOUNTER — Ambulatory Visit (HOSPITAL_COMMUNITY): Payer: Medicare Other | Admitting: Anesthesiology

## 2011-01-09 ENCOUNTER — Other Ambulatory Visit: Payer: Self-pay | Admitting: Gastroenterology

## 2011-01-09 ENCOUNTER — Ambulatory Visit (HOSPITAL_COMMUNITY)
Admission: RE | Admit: 2011-01-09 | Discharge: 2011-01-09 | Disposition: A | Discharge status: Home / Self Care | Payer: Medicare Other | Source: Ambulatory Visit | Attending: Gastroenterology | Admitting: Gastroenterology

## 2011-01-09 ENCOUNTER — Encounter (HOSPITAL_COMMUNITY): Payer: Self-pay

## 2011-01-09 DIAGNOSIS — Z79899 Other long term (current) drug therapy: Secondary | ICD-10-CM | POA: Insufficient documentation

## 2011-01-09 DIAGNOSIS — Z8614 Personal history of Methicillin resistant Staphylococcus aureus infection: Secondary | ICD-10-CM | POA: Insufficient documentation

## 2011-01-09 DIAGNOSIS — Z01812 Encounter for preprocedural laboratory examination: Secondary | ICD-10-CM | POA: Insufficient documentation

## 2011-01-09 DIAGNOSIS — Z7982 Long term (current) use of aspirin: Secondary | ICD-10-CM | POA: Insufficient documentation

## 2011-01-09 DIAGNOSIS — I10 Essential (primary) hypertension: Secondary | ICD-10-CM | POA: Insufficient documentation

## 2011-01-09 DIAGNOSIS — K2940 Chronic atrophic gastritis without bleeding: Secondary | ICD-10-CM | POA: Insufficient documentation

## 2011-01-09 DIAGNOSIS — K219 Gastro-esophageal reflux disease without esophagitis: Secondary | ICD-10-CM

## 2011-01-09 DIAGNOSIS — E785 Hyperlipidemia, unspecified: Secondary | ICD-10-CM | POA: Insufficient documentation

## 2011-01-09 HISTORY — PX: BRAVO PH STUDY: SHX5421

## 2011-01-09 SURGERY — ESOPHAGOGASTRODUODENOSCOPY (EGD) WITH PROPOFOL
Anesthesia: Monitor Anesthesia Care | Site: Throat

## 2011-01-09 MED ORDER — STERILE WATER FOR IRRIGATION IR SOLN
Status: DC | PRN
  Administered 2011-01-09: 11:00:00

## 2011-01-09 MED ORDER — ONDANSETRON HCL 4 MG/2ML IJ SOLN: 4.0000 mg | Freq: Once | INTRAMUSCULAR | Status: DC | PRN

## 2011-01-09 MED ORDER — MIDAZOLAM HCL 5 MG/5ML IJ SOLN
INTRAMUSCULAR | Status: DC | PRN
  Administered 2011-01-09: 2 mg via INTRAVENOUS

## 2011-01-09 MED ORDER — ONDANSETRON HCL 4 MG/2ML IJ SOLN
INTRAMUSCULAR | Status: AC
  Administered 2011-01-09: 4 mg via INTRAVENOUS
  Filled 2011-01-09: qty 2

## 2011-01-09 MED ORDER — GLYCOPYRROLATE 0.2 MG/ML IJ SOLN
INTRAMUSCULAR | Status: AC
  Administered 2011-01-09: 0.2 mg via INTRAVENOUS
  Filled 2011-01-09: qty 1

## 2011-01-09 MED ORDER — MIDAZOLAM HCL 2 MG/2ML IJ SOLN
1.0000 mg | INTRAMUSCULAR | Status: DC | PRN
  Administered 2011-01-09: 2 mg via INTRAVENOUS

## 2011-01-09 MED ORDER — MIDAZOLAM HCL 2 MG/2ML IJ SOLN
INTRAMUSCULAR | Status: AC
  Administered 2011-01-09: 2 mg via INTRAVENOUS
  Filled 2011-01-09: qty 2

## 2011-01-09 MED ORDER — PROPOFOL 10 MG/ML IV EMUL
INTRAVENOUS | Status: DC | PRN
  Administered 2011-01-09: 100 ug/kg/min via INTRAVENOUS

## 2011-01-09 MED ORDER — SCOPOLAMINE 1 MG/3DAYS TD PT72
MEDICATED_PATCH | TRANSDERMAL | Status: AC
  Administered 2011-01-09: 1.5 mg via TRANSDERMAL
  Filled 2011-01-09: qty 1

## 2011-01-09 MED ORDER — GLYCOPYRROLATE 0.2 MG/ML IJ SOLN
0.2000 mg | Freq: Once | INTRAMUSCULAR | Status: AC
  Administered 2011-01-09: 0.2 mg via INTRAVENOUS

## 2011-01-09 MED ORDER — PROPOFOL 10 MG/ML IV EMUL
INTRAVENOUS | Status: AC
  Filled 2011-01-09: qty 20

## 2011-01-09 MED ORDER — BUTAMBEN-TETRACAINE-BENZOCAINE 2-2-14 % EX AERO
1.0000 | INHALATION_SPRAY | Freq: Once | CUTANEOUS | Status: AC
  Administered 2011-01-09: 1 via TOPICAL
  Filled 2011-01-09: qty 56

## 2011-01-09 MED ORDER — LACTATED RINGERS IV SOLN
INTRAVENOUS | Status: DC
  Administered 2011-01-09: 10:00:00 via INTRAVENOUS

## 2011-01-09 MED ORDER — PROPOFOL 10 MG/ML IV EMUL
INTRAVENOUS | Status: DC | PRN
  Administered 2011-01-09: 10 mg via INTRAVENOUS
  Administered 2011-01-09: 20 mg via INTRAVENOUS
  Administered 2011-01-09: 10 mg via INTRAVENOUS

## 2011-01-09 MED ORDER — MIDAZOLAM HCL 2 MG/2ML IJ SOLN
INTRAMUSCULAR | Status: AC
  Filled 2011-01-09: qty 2

## 2011-01-09 MED ORDER — SCOPOLAMINE 1 MG/3DAYS TD PT72
1.0000 | MEDICATED_PATCH | Freq: Once | TRANSDERMAL | Status: DC
  Administered 2011-01-09: 1.5 mg via TRANSDERMAL

## 2011-01-09 MED ORDER — ONDANSETRON HCL 4 MG/2ML IJ SOLN
4.0000 mg | Freq: Once | INTRAMUSCULAR | Status: AC
  Administered 2011-01-09: 4 mg via INTRAVENOUS

## 2011-01-09 SURGICAL SUPPLY — 18 items
BLOCK BITE 60FR ADLT L/F BLUE (MISCELLANEOUS) ×2 IMPLANT
CAPSULE BRAVO RADIO TELEMETRY (MISCELLANEOUS) ×1 IMPLANT
ELECT REM PT RETURN 9FT ADLT (ELECTROSURGICAL)
ELECTRODE REM PT RTRN 9FT ADLT (ELECTROSURGICAL) IMPLANT
FLOOR PAD 36X40 (MISCELLANEOUS) ×2
FORCEP RJ3 GP 1.8X160 W-NEEDLE (CUTTING FORCEPS) IMPLANT
FORCEPS BIOP RAD 4 LRG CAP 4 (CUTTING FORCEPS) IMPLANT
NDL SCLEROTHERAPY 25GX240 (NEEDLE) IMPLANT
NEEDLE SCLEROTHERAPY 25GX240 (NEEDLE) IMPLANT
PAD FLOOR 36X40 (MISCELLANEOUS) ×1 IMPLANT
PROBE APC STR FIRE (PROBE) IMPLANT
PROBE INJECTION GOLD (MISCELLANEOUS)
PROBE INJECTION GOLD 7FR (MISCELLANEOUS) IMPLANT
SNARE SHORT THROW 13M SML OVAL (MISCELLANEOUS) IMPLANT
SYR 50ML LL SCALE MARK (SYRINGE) ×1 IMPLANT
TUBING ENDO SMARTCAP PENTAX (MISCELLANEOUS) ×4 IMPLANT
TUBING IRRIGATION ENDOGATOR (MISCELLANEOUS) ×2 IMPLANT
WATER STERILE IRR 1000ML POUR (IV SOLUTION) ×1 IMPLANT

## 2011-01-09 NOTE — Transfer of Care (Signed)
Immediate Anesthesia Transfer of Care Note  Patient: Stacy Elliott  Procedure(s) Performed:  ESOPHAGOGASTRODUODENOSCOPY (EGD) WITH PROPOFOL; BRAVO PH STUDY - placed 31cm from teeth  Patient Location: PACU  Anesthesia Type: MAC  Level of Consciousness: awake, alert  and oriented  Airway & Oxygen Therapy: Patient Spontanous Breathing  Post-op Assessment: Report given to PACU RN  Post vital signs: Reviewed and stable  Complications: No apparent anesthesia complications

## 2011-01-09 NOTE — H&P (Signed)
Reason for Visit     Follow-up    Gastrophageal Reflux          Vitals - Last Recorded       BP  Pulse  Temp(Src)  Ht  Wt  BMI    109/67  69  97.6 F (36.4 C) (Temporal)  5' (1.524 m)  130 lb 9.6 oz (59.24 kg)  25.51 kg/m2             Progress Notes     Jonette Eva, MD 12/29/2010 9:31 AM Signed    Subjective:    Patient ID: Stacy Elliott, female DOB: [birth year], 60 y.o. MRN: [id number]  PCP: MILAM  HPI  Pt has uncontrolled reflux for the past 10 years. Tried on most recently Dexilant and it failed, as well as Protonix, Prilosec, Prevacid. Nexium works 50% of the time. Last BPE: 2011-nl esophagus. Has had 5-6 EGDs for indigestion and dysphagia since 2002. Swallowing improved after 75% after EGD/dil. Has nausea everyday and use Phenergan. No vomiting. Bms: one day goes and may have no BM for one week. Can have diarrhea all day: yellow. No blood in stool or tarry stools. Pain: hurts all the time, all her life-pain moves around not in one spot. Reflux Sx: burning and tongue stays sore. Went to Dentist 4 mos ago and he said the "acid is wearing on her teeth enamel". Recommended MOM rinse prn when tongue is sore. Never had reflux surgery.     Past Medical History     Diagnosis  Date     .  Mouth sores      .  Dysphagia  2002 BASW-SML HH/GERD  2011 BPE: NL ESO MOT, NO REFLUX     .  GERD (gastroesophageal reflux disease)      .  Anxiety disorder      .  Depression      .  Hyperlipemia      .  Hypertension      .  Hypothyroidism      .  Ileitis, terminal  1993     .  History of MRSA infection  2011 NOSE     .  Obesity (BMI 30-39.9)  2011 200 LBS       MAR 2012 147 LBS     .  Diarrhea  INTERMITTENT       JAN 2010 HBT SIBO (1pk 26 ppm), ABX-JAN & MAY 2010     .  Inflammatory bowel disease  1993 ?CHRON'S ILEITIS       PATH: NO GRANULOMAS OR CRYPT DISTORTION, NL COLON     .  Irritable bowel syndrome  DIARRHEA     .  Asthma      Past Surgical History     Procedure  Date      .  Esophagogastroduodenoscopy  2008 DIL DR. ORR       2010 DIL 17 MM SAV-->EGD/DIL RMR MAR 2012     .  Back surgery      .  Cholecystectomy      .  Thyroid surgery      .  Tonsillectomy      .  Colon resection  1993 RIGHT       TERMINAL ILEITIS, NO GRANULOMAS/CRYPT DISTORTION     .  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  Colonoscopy  NOV 2011  ILEOTCS-NL neo-TI & COLON, Bx: nl q10 cm     .  Colonoscopy  NOV 2010 ?IBD> 10 YEARS       ILEOTCS-NL neo-TI & COLON, Bx: nl q10 cm         Allergies     Allergen  Reactions     .  Codeine        REACTION: hives     .  Fentanyl        REACTION: hives / closing of throat     .  Penicillins        REACTION: hives     .  Sulfonamide Derivatives        REACTION: N/V         Current Outpatient Prescriptions     Medication  Sig  Dispense  Refill     .  ALPRAZolam (XANAX) 0.5 MG tablet  Take 0.5 mg by mouth at bedtime as needed.       Marland Kitchen  aspirin 81 MG tablet  Take 81 mg by mouth daily.       Marland Kitchen  buPROPion (WELLBUTRIN XL) 300 MG 24 hr tablet        .  carvedilol (COREG) 12.5 MG tablet  Take 12.5 mg by mouth 2 (two) times daily with a meal.       .  esomeprazole (NEXIUM) 40 MG capsule  1 po 30 minutes prior to meals twice daily       .  fexofenadine (ALLEGRA) 180 MG tablet  Take 180 mg by mouth daily.       Marland Kitchen  FLUoxetine (PROZAC) 20 MG tablet  Take 20 mg by mouth 3 (three) times daily.       Marland Kitchen  JINTELI 1-5 MG-MCG TABS  1 tablet daily.       Marland Kitchen  losartan-hydrochlorothiazide (HYZAAR) 100-12.5 MG per tablet  Take 1 tablet by mouth daily.       .  mesalamine (PENTASA) 250 MG CR capsule  Take 3 capsules (750 mg total) by mouth 3 (three) times daily.       .  montelukast (SINGULAIR) 10 MG tablet  Take 10 mg by mouth at bedtime.       .  nebivolol (BYSTOLIC) 5 MG tablet  Take 5 mg by mouth 2 (two) times daily.       .  pravastatin (PRAVACHOL) 80 MG tablet  Take 80 mg by mouth daily.        .  promethazine (PHENERGAN) 25 MG tablet  1/2 to 1 po q4-6h prn for nausea or vomiting       .  sucralfate (CARAFATE) 1 GM/10ML suspension  Take 1 g by mouth 3 (three) times daily as needed.       .  ALPRAZolam (XANAX) 0.25 MG tablet  Take 0.25 mg by mouth 3 (three) times daily.       Review of Systems  All other systems reviewed and are negative.        Objective:      Physical Exam  Vitals reviewed.  Constitutional: She is oriented to person, place, and time. She appears well-developed and well-nourished. No distress.  HENT:  Head: Normocephalic and atraumatic.  Mouth/Throat: Oropharynx is clear and moist. No oropharyngeal exudate.  Eyes: Pupils are equal, round, and reactive to light. No scleral icterus.  Neck: Normal range of motion. Neck supple.  Cardiovascular: Normal rate, regular rhythm and normal heart sounds.  Pulmonary/Chest: Effort normal and breath sounds normal.  Abdominal: Soft. Bowel sounds are normal. She exhibits no distension. There is no tenderness.  Musculoskeletal: She exhibits no edema.  Neurological: She is alert and oriented to person, place, and time.  NO FOCAL DEFICITS Skin: Skin is warm and dry.  Psychiatric: She has a normal mood and affect.        Assessment & Plan:       Stacy Elliott 12/29/2010 10:28 AM Signed  Reminder in epic to follow up in 3 months with SF in E30 visit Leigh A Watson 12/29/2010 2:27 PM Signed  Cc to PCP      GERD - Jonette Eva, MD 12/29/2010 9:23 AM Signed  Sx uncontrolled on Dexilant pt now back on Nexium BID. Differential diagnosis includes uncontrolled acid reflux exacerbated by gastroparesis , uncontrolled non-acid reflux, or non-ulcer dyspepsia.  GES WITHIN THE NEXT WEEK.  EGD W/ PROPOFOL DUE TO POLYPHARMACY FOR BRAVO CAPSULE PLACEMENT.  WILL DECIDE IF PT NEED REFLUX SURGERY, BACLOFEN, OR INCREASED SSRI OR TCA AFTER EGD. Pt voiced understanding.  CONTINUE NEXIUM.  OPV IN 3 MOS E: 30 VISIT.

## 2011-01-09 NOTE — Addendum Note (Signed)
Addendum  created 01/09/11 1134 by Glynn Octave   Modules edited:Anesthesia Review and Sign Navigator Section

## 2011-01-09 NOTE — Anesthesia Postprocedure Evaluation (Signed)
  Anesthesia Post-op Note  Patient: Stacy Elliott  Procedure(s) Performed:  ESOPHAGOGASTRODUODENOSCOPY (EGD) WITH PROPOFOL; BRAVO PH STUDY - placed 31cm from teeth  Patient Location: PACU  Anesthesia Type: MAC  Level of Consciousness: awake, alert  and oriented  Airway and Oxygen Therapy: Patient Spontanous Breathing  Post-op Pain: none  Post-op Assessment: Post-op Vital signs reviewed, Patient's Cardiovascular Status Stable, Respiratory Function Stable and Patent Airway  Post-op Vital Signs: Reviewed and stable  Complications: No apparent anesthesia complications

## 2011-01-09 NOTE — Addendum Note (Signed)
Addendum  created 01/09/11 1135 by Glynn Octave   Modules edited:Anesthesia Review and Sign Navigator Section

## 2011-01-09 NOTE — Anesthesia Preprocedure Evaluation (Addendum)
Anesthesia Evaluation  Patient identified by MRN, date of birth, ID band Patient awake    Reviewed: Allergy & Precautions, H&P , NPO status , Patient's Chart, lab work & pertinent test results, reviewed documented beta blocker date and time   History of Anesthesia Complications (+) PONV  Airway Mallampati: II      Dental  (+) Teeth Intact   Pulmonary asthma ,    Pulmonary exam normal       Cardiovascular hypertension, Pt. on medications Regular Normal    Neuro/Psych PSYCHIATRIC DISORDERS Anxiety Depression    GI/Hepatic hiatal hernia, GERD-  Medicated,  Endo/Other  Hypothyroidism   Renal/GU      Musculoskeletal   Abdominal   Peds  Hematology   Anesthesia Other Findings   Reproductive/Obstetrics                           Anesthesia Physical Anesthesia Plan  ASA: III  Anesthesia Plan: MAC   Post-op Pain Management:    Induction:   Airway Management Planned: Simple Face Mask  Additional Equipment:   Intra-op Plan:   Post-operative Plan:   Informed Consent: I have reviewed the patients History and Physical, chart, labs and discussed the procedure including the risks, benefits and alternatives for the proposed anesthesia with the patient or authorized representative who has indicated his/her understanding and acceptance.     Plan Discussed with:   Anesthesia Plan Comments: (Allergic to fentanyl PATCH only.)       Anesthesia Quick Evaluation

## 2011-01-09 NOTE — Interval H&P Note (Signed)
History and Physical Interval Note:   01/09/2011   9:55 AM   Stacy Elliott  has presented today for surgery, with the diagnosis of nausea & GERD  The various methods of treatment have been discussed with the patient and family. After consideration of risks, benefits and other options for treatment, the patient has consented to  Procedure(s): ESOPHAGOGASTRODUODENOSCOPY (EGD) WITH PROPOFOL BRAVO PH STUDY as a surgical intervention .  The patients' history has been reviewed, patient examined, no change in status, stable for surgery.  I have reviewed the patients' chart and labs.  Questions were answered to the patient's satisfaction.     Jonette Eva  MD  THE PATIENT WAS EXAMINED AND THERE IS NO CHANGE IN THE PATIENT'S CONDITION SINCE THE ORIGINAL H&P WAS COMPLETED.\

## 2011-01-11 ENCOUNTER — Telehealth: Payer: Self-pay | Admitting: Gastroenterology

## 2011-01-11 NOTE — Telephone Encounter (Signed)
Pt called wanting her results from 01/09/11

## 2011-01-13 ENCOUNTER — Other Ambulatory Visit: Payer: Self-pay | Admitting: Gastroenterology

## 2011-01-13 ENCOUNTER — Encounter (HOSPITAL_COMMUNITY): Payer: Self-pay | Admitting: Gastroenterology

## 2011-01-13 DIAGNOSIS — K5090 Crohn's disease, unspecified, without complications: Secondary | ICD-10-CM

## 2011-01-16 ENCOUNTER — Telehealth: Payer: Self-pay | Admitting: Gastroenterology

## 2011-01-16 NOTE — Telephone Encounter (Signed)
Called and informed pt. Rx called to [facility] at [facility] on [address] in [place].

## 2011-01-16 NOTE — Telephone Encounter (Signed)
REVIEWED.  

## 2011-01-16 NOTE — Telephone Encounter (Signed)
Please call pt. HER stomach Bx shows mild gastritis. Continue NEXIUM 30 minutes prior to meals BID.

## 2011-01-16 NOTE — Telephone Encounter (Signed)
Results Cc to PCP  

## 2011-01-16 NOTE — Telephone Encounter (Signed)
There is a reminder in epic for patient to follow up in Jan 2013

## 2011-01-16 NOTE — Telephone Encounter (Signed)
Patient is in severe pain in her throat down to her breast bone everything she swallows is hurting even water. She says she feels worse after her procedure than before please advise?

## 2011-01-16 NOTE — Brief Op Note (Signed)
01/09/2011  11:37 AM  PATIENT:  Stacy Elliott  60 y.o. female  PRE-OPERATIVE DIAGNOSIS:  nausea & GERD  POST-OPERATIVE DIAGNOSIS:  gastritis     PROCEDURE:  Procedure(s): ESOPHAGOGASTRODUODENOSCOPY (EGD) WITH PROPOFOL BRAVO PH STUDY  SURGEON:  Surgeon(s): Arlyce Harman, MD   PROCEDURE:  Procedure(s): BRAVO PH STUDY ON DEXILANT 60 MG DAILY  SURGEON:  Surgeon(s): Arlyce Harman, MD  FINDINGS:  PT HAD RECORDER FOR 48 HOURS. STUDY DURATION: 1d, 22:09.   FRACTION Ph <4 TOTAL: 2.4   # OF REFLUXES: 65   LONG REFLUX > 5: 5   LONGEST REFLUX: 10 MINS   REFLUX TABLE: UPRIGHT 17 EPISODE, POSTPRANDIAL 3  DEMEESTER SCORE DAY 1:  11.4 (NL < 14.72)  DEMEESTER SCORE DAY 2:  7.5 (NL < 14.72) DEMEESTER SCORE TOTAL:  9.4 (NL < 14.72)   DIAGNOSIS: NON-ACID REFLUX V. NON-ULCER DYSPEPSIA  PLAN: 1. ADD BACLOFEN QAC TID. PT TAKING BUPROPION AND PROZAC. 2. CONTINUE NEXIUM BID. 3. OPV IN FEB 2013.

## 2011-01-16 NOTE — Telephone Encounter (Signed)
Please call pt. Her Bravo study shows her acid reflux is controlled with Nexium bid. However she also has evidence for non-acid reflux. She should continue her Nexium BID. Follow a low fat diet. She consumes too many of the wrong foods and makes her non-acid reflux worse. For example her food diary shows she consumes french fries, biscuits, diet coke, chocolate frosties, bacon and cheese, tea, and ginger ale. Carbonated beverages, caffeine, high fat foods, and chocolate all make reflux acid or non-acid worse because it relaxes the bottom of her esophagus. She may add Baclofen 10 mg at bedtime for 7 days then 1 po before breakfast and lunch, #12m rfx11. She does not need surgery. OPV IN FEB 2013. Call in 2 weeks if her symptom are not better. S

## 2011-01-16 NOTE — Telephone Encounter (Signed)
Called pt. Said she was having some problems before the procedure, but it has been much worse since the procedure. Please advise!

## 2011-01-16 NOTE — Telephone Encounter (Signed)
Pt informed. See phone note of 01/16/2011.

## 2011-01-17 NOTE — Telephone Encounter (Signed)
Called, line busy.  

## 2011-01-18 NOTE — Telephone Encounter (Signed)
Pt informed

## 2011-03-06 ENCOUNTER — Encounter: Payer: Self-pay | Admitting: Gastroenterology

## 2011-03-20 ENCOUNTER — Telehealth: Payer: Self-pay | Admitting: Gastroenterology

## 2011-03-20 NOTE — Telephone Encounter (Signed)
Called pt and she said she thinks that she has another bacterial infection and she is requesting the 2 antibiotics to  Take prior to being scheduled for her back surgery in Feb. She said she is having very bad gas and diarrhea, but not very bad, not enough to concern with dehydration. She just feels she needs the antibiotics prior to her back surgery in Feb. Darl Pikes offered appt with Dr. Darrick Penna on 03/30/2011 and she declined. Then I spoke with her and was telling her that we could get her in to see an extender and she decided that she would take the appt on 03/30/2011 with Dr. Darrick Penna.

## 2011-03-20 NOTE — Telephone Encounter (Signed)
Pt called this morning while computers were down. She needs to speak with [facility] nurse regarding her getting antibiotics prior to her having back surgery. Please call her at [phone]  Her pharmacy is now at [facility] in [place] on [address].

## 2011-03-22 ENCOUNTER — Telehealth: Payer: Self-pay

## 2011-03-22 NOTE — Telephone Encounter (Signed)
Called and left message of all of the info on VM for pt. Rx's called to [place] at [facility] in [place]. [facility] for pt to call me and let me know that she got this message)

## 2011-03-22 NOTE — Telephone Encounter (Signed)
Please call pt. She may have CIP 500 mg bid for 5 days and Flagyl 500 mg bid for 5 days, #qs, rfx0. Stop taking meds if she has pain in her heels and call me. Do not drink Etoh or use cough syrup because it will make her vomit. OPV JAN 31.

## 2011-03-22 NOTE — Telephone Encounter (Signed)
Pt returned call and was again informed of all info and said that she will keep appt on 03/30/2011.

## 2011-03-22 NOTE — Telephone Encounter (Signed)
Opened in error

## 2011-03-30 ENCOUNTER — Ambulatory Visit (INDEPENDENT_AMBULATORY_CARE_PROVIDER_SITE_OTHER): Payer: Medicare Other | Admitting: Gastroenterology

## 2011-03-30 ENCOUNTER — Encounter: Payer: Self-pay | Admitting: Gastroenterology

## 2011-03-30 DIAGNOSIS — K5090 Crohn's disease, unspecified, without complications: Secondary | ICD-10-CM

## 2011-03-30 DIAGNOSIS — K9089 Other intestinal malabsorption: Secondary | ICD-10-CM

## 2011-03-30 DIAGNOSIS — K219 Gastro-esophageal reflux disease without esophagitis: Secondary | ICD-10-CM

## 2011-03-30 DIAGNOSIS — R143 Flatulence: Secondary | ICD-10-CM

## 2011-03-30 DIAGNOSIS — R141 Gas pain: Secondary | ICD-10-CM

## 2011-03-30 MED ORDER — ESOMEPRAZOLE MAGNESIUM 40 MG PO CPDR: DELAYED_RELEASE_CAPSULE | ORAL | Status: DC

## 2011-03-30 MED ORDER — MESALAMINE ER 250 MG PO CPCR: 750.0000 mg | ORAL_CAPSULE | Freq: Three times a day (TID) | ORAL | Status: DC

## 2011-03-30 MED ORDER — MESALAMINE ER 250 MG PO CPCR
750.0000 mg | ORAL_CAPSULE | Freq: Three times a day (TID) | ORAL | Status: DC
Start: 2011-03-30 — End: 2011-11-15

## 2011-03-30 NOTE — Assessment & Plan Note (Signed)
sX IMPROVED AFTER BACLOFEN ADDED.  CONTINUE BACLOFEN. OPV IN 6 MOS.

## 2011-03-30 NOTE — Progress Notes (Signed)
Faxed to PCP

## 2011-03-30 NOTE — Assessment & Plan Note (Signed)
KNOWN  ZO:[id number]. SX IMPROVED AFTER ABX.  MAY REPEAT ABX Q MO. OPV IN 6 MOS.

## 2011-03-30 NOTE — Assessment & Plan Note (Signed)
LAST TCS 2011. NL COLON Bx.  REPEAT TCS NOV 2013.

## 2011-03-30 NOTE — Assessment & Plan Note (Signed)
DISCUSSED ELIMINATING FIBER SOURCES ONE BY ONE TO SEE IF SHE CAN IDENTIFY WHICH SOURCE MAY CAUSE EXTRA INTESTINAL GAS.

## 2011-03-30 NOTE — Patient Instructions (Signed)
CONTINUE NEXIUM.  CALL IF DIARRHEA COMES BACK. CAN REPEAT ANTIBIOTICS ONCE A MONTH.

## 2011-03-30 NOTE — Progress Notes (Signed)
Subjective:    Patient ID: Stacy Elliott, female    DOB: Aug 27, 1950, 61 y.o.   MRN: [id number]  HPI  NOV 2012: Her Bravo study showED her acid reflux is controlled with Nexium bid. However she also has evidence for non-acid reflux. ADD BACLOFEN 10 MG BEFORE BREAKFAST & LUNCH  PT CALLED said she thinks that she has another bacterial infection and she is requesting the 2 antibiotics to Take prior to being scheduled for her back surgery in Feb. She said she is having very bad gas and diarrhea, but not very bad, not enough to concern with dehydration. She just feels she needs the antibiotics prior to her back surgery in Feb. She WAS GIVEN CIP 500 mg bid for 5 days and Flagyl 500 mg bid for 5 days.  HERE FOR F/U. BOWELS ARE BETTER. BM: REGULAR-1X/DAY(LOOSE, NL). Now having back pain/problems. Has so much gas. No nausea or vomiting. REFLUX Sx IMPROVED.  Past Medical History  Diagnosis Date  . Mouth sores   . Dysphagia 2002 BASW-SML HH/GERD  . GERD (gastroesophageal reflux disease)   . Anxiety disorder   . Depression   . Hyperlipemia   . Hypertension   . Hypothyroidism   . Ileitis, terminal 1993  . History of MRSA infection 2011 NOSE  . Obesity (BMI 30-39.9) 2011 200 LBS    MAR 2012 147 LBS  . Diarrhea INTERMITTENT    JAN 2010 HBT SIBO (1pk 26 ppm), ABX-JAN & MAY 2010  . Inflammatory bowel disease 1993 ?CHRON'S ILEITIS    PATH: NO GRANULOMAS OR CRYPT DISTORTION, NL COLON  . Irritable bowel syndrome DIARRHEA  . Asthma   . PONV (postoperative nausea and vomiting)   . Hiatal hernia   . Arthritis     osteoarthritis    Past Surgical History  Procedure Date  . Esophagogastroduodenoscopy 2008 DIL DR. ORR    2010 DIL 17 MM SAV-->EGD/DIL RMR MAR 2012  . Thyroid surgery 89    partail thyroidectomy-[facility]  . Colon resection 1993 RIGHT    TERMINAL ILEITIS, NO GRANULOMAS/CRYPT DISTORTION  . 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  Colonoscopy NOV 2011     ILEOTCS-NL  neo-TI & COLON, Bx: nl q10 cm  . Colonoscopy NOV 2010 ?IBD> 10 YEARS    ILEOTCS-NL  neo-TI & COLON, Bx: nl q10 cm  . Back surgery 2003    [facility]  . Tonsillectomy age 61    [facility]  . Cholecystectomy     [facility]  . Tubal ligation age 6    [facility]  . Cataract extraction, bilateral   . Bravo ph study 01/09/2011    Procedure: BRAVO PH STUDY;  Surgeon: Arlyce Harman, MD;  Location: [facility];  Service: Endoscopy;  Laterality: N/A;  placed 31cm from teeth    Allergies  Allergen Reactions  . Fentanyl Anaphylaxis  . Codeine Hives  . Imuran (Azathioprine) Other (See Comments)    Chemically induced meningitis   . Penicillins Hives  . Sulfonamide Derivatives Nausea And Vomiting   Current Outpatient Prescriptions  Medication Sig Dispense Refill  . acidophilus (RISAQUAD) CAPS Take 1 capsule by mouth 2 (two) times daily.        Marland Kitchen ALPRAZolam (XANAX) 0.5 MG tablet Take 0.5 mg by mouth 3 (three) times daily.       Marland Kitchen aspirin 81 MG chewable tablet Chew 81 mg by mouth every morning.        Marland Kitchen buPROPion [facility]  XL) 300 MG 24 hr tablet Take 300 mg by mouth every morning.       . calcium-vitamin D (OSCAL WITH D) 500-200 MG-UNIT per tablet Take 1 tablet by mouth 2 (two) times daily.        . carvedilol (COREG) 12.5 MG tablet Take 12.5 mg by mouth 2 (two) times daily with a meal.     . esomeprazole (NEXIUM) 40 MG capsule 1 po 30 minutes prior to meals twice daily    . fexofenadine (ALLEGRA) 180 MG tablet Take 180 mg by mouth daily as needed. For allergies    . FLUoxetine (PROZAC) 20 MG tablet Take 20 mg by mouth 3 (three) times daily.     Marland Kitchen JINTELI 1-5 MG-MCG TABS Take 1 tablet by mouth every morning.     Marland Kitchen losartan-hydrochlorothiazide (HYZAAR) 100-12.5 MG per tablet Take 1 tablet by mouth every morning.     . mesalamine (PENTASA) 250 MG CR capsule Take 3 capsules (750 mg total) by mouth 3 (three) times daily.    . montelukast (SINGULAIR) 10 MG tablet Take  10 mg by mouth at bedtime.       . pravastatin (PRAVACHOL) 80 MG tablet Take 80 mg by mouth at bedtime.       . promethazine (PHENERGAN) 25 MG tablet Take 25 mg by mouth every 6 (six) hours as needed. 1/2 to 1 po q4-6h prn for nausea or vomiting       . sucralfate (CARAFATE) 1 GM/10ML suspension Take 1 g by mouth 3 (three) times daily as needed.        . traMADol (ULTRAM) 50 MG tablet Take 50 mg by mouth every 6 (six) hours as needed.       BACLOFEN 10 MG BEFORE BREAKFAST & LUNCH.   Review of Systems     Objective:   Physical Exam  Vitals reviewed. Constitutional: She is oriented to person, place, and time. She appears well-nourished. She appears distressed (UNABLE TO SIT STILL DUE TO BACK DISCOMFORT).  HENT:  Head: Normocephalic and atraumatic.  Mouth/Throat: Oropharynx is clear and moist. No oropharyngeal exudate.  Eyes: Pupils are equal, round, and reactive to light. No scleral icterus.  Neck: Normal range of motion. Neck supple.  Cardiovascular: Normal rate, regular rhythm and normal heart sounds.   Pulmonary/Chest: Effort normal and breath sounds normal. No respiratory distress.  Abdominal: Soft. Bowel sounds are normal. She exhibits no distension. There is no tenderness.  Neurological: She is alert and oriented to person, place, and time.       NO  NEW FOCAL DEFICITS    Psychiatric:       ANXIOUS MOOD, NL AFFECT          Assessment & Plan:

## 2011-04-05 NOTE — Progress Notes (Signed)
Reminder in epic to follow up in 6 months °

## 2011-05-17 ENCOUNTER — Other Ambulatory Visit: Payer: Self-pay

## 2011-05-18 MED ORDER — PROMETHAZINE HCL 25 MG PO TABS: ORAL_TABLET | ORAL | Status: DC

## 2011-07-11 ENCOUNTER — Other Ambulatory Visit: Payer: Self-pay

## 2011-07-11 MED ORDER — PROMETHAZINE HCL 25 MG PO TABS: ORAL_TABLET | ORAL | Status: DC

## 2011-09-26 ENCOUNTER — Encounter: Payer: Self-pay | Admitting: Gastroenterology

## 2011-10-13 ENCOUNTER — Telehealth: Payer: Self-pay | Admitting: *Deleted

## 2011-10-13 MED ORDER — PROMETHAZINE HCL 25 MG PO TABS: ORAL_TABLET | ORAL | Status: DC

## 2011-10-13 NOTE — Telephone Encounter (Signed)
Ms Goehring called today. She has changed pharmacies and needs a refill for phenergren.  The new pharmacy is [facility], [phone]. Thank you.

## 2011-11-06 ENCOUNTER — Other Ambulatory Visit: Payer: Self-pay | Admitting: Gastroenterology

## 2011-11-14 ENCOUNTER — Encounter: Payer: Self-pay | Admitting: Gastroenterology

## 2011-11-15 ENCOUNTER — Encounter: Payer: Self-pay | Admitting: Gastroenterology

## 2011-11-15 ENCOUNTER — Ambulatory Visit (INDEPENDENT_AMBULATORY_CARE_PROVIDER_SITE_OTHER): Payer: Medicare Other | Admitting: Gastroenterology

## 2011-11-15 VITALS — BP 150/81 | HR 82 | Temp 98.4°F | Ht 60.0 in | Wt 192.8 lb

## 2011-11-15 DIAGNOSIS — K219 Gastro-esophageal reflux disease without esophagitis: Secondary | ICD-10-CM

## 2011-11-15 DIAGNOSIS — K5090 Crohn's disease, unspecified, without complications: Secondary | ICD-10-CM

## 2011-11-15 MED ORDER — ESOMEPRAZOLE MAGNESIUM 40 MG PO CPDR: DELAYED_RELEASE_CAPSULE | ORAL | Status: DC

## 2011-11-15 MED ORDER — MESALAMINE ER 250 MG PO CPCR
750.0000 mg | ORAL_CAPSULE | Freq: Three times a day (TID) | ORAL | Status: DC
Start: 2011-11-15 — End: 2012-06-26

## 2011-11-15 MED ORDER — PROMETHAZINE HCL 25 MG PO TABS: ORAL_TABLET | ORAL | Status: DC

## 2011-11-15 NOTE — Progress Notes (Signed)
Faxed to PCP

## 2011-11-15 NOTE — Patient Instructions (Addendum)
COLONOSCOPY IN NOV 2013.  SEE DR. NIDA FOR ENDOCRINE EVALUATION.  CONTINUE NEXIUM. MAY USE BACLOFEN AS NEEDED.  CONTINUE PENTASA.  FOLLOW UP IN 6 MOS.

## 2011-11-15 NOTE — Assessment & Plan Note (Signed)
SX CONTROLLED WITH NEXIUM BID. BACLOFENPRN.  SEE DR. NIDA FOR ENDOCRINE EVALUATION.  CONTINUE NEXIUM. MAY USE BACLOFEN AS NEEDED.  FOLLOW UP IN 6 MOS.

## 2011-11-15 NOTE — Assessment & Plan Note (Signed)
SX CONTROLLED.  COLONOSCOPY IN NOV 2013.  CONTINUE PENTASA.  FOLLOW UP IN 6 MOS.

## 2011-11-15 NOTE — Progress Notes (Signed)
Subjective:    Patient ID: Stacy Elliott, female    DOB: Jan 24, 1951, 61 y.o.   MRN: [id number]  PCP: [facility]  HPI GAINED 62 LBS SINCE NOV 2012. HAVING DIFFICULTY WITH A CHOKING SENSATION AND WANTS TO SEE AN ENDOCRINOLOGIST. NEEDS RX REFILL. WANTS ME TO LOOK AT HER BELLY BUTTON-REDNESS AND PAIN @ [facility]. BMS: GOOD AND BAD DAYS. NO BLOOD IN STOOL  Past Medical History  Diagnosis Date  . Mouth sores   . Dysphagia 2002 BASW-SML HH/GERD  . GERD (gastroesophageal reflux disease)   . Anxiety disorder   . Depression   . Hyperlipemia   . Hypertension   . Hypothyroidism   . Ileitis, terminal 1993  . History of MRSA infection 2011 NOSE  . Obesity (BMI 30-39.9) 2011 200 LBS    MAR 2012 147 LBS  . Diarrhea INTERMITTENT    JAN 2010 HBT SIBO (1pk 26 ppm), ABX-JAN & MAY 2010  . Inflammatory bowel disease 1993 ?CHRON'S ILEITIS    PATH: NO GRANULOMAS OR CRYPT DISTORTION, NL COLON  . Irritable bowel syndrome DIARRHEA  . Asthma   . PONV (postoperative nausea and vomiting)   . Hiatal hernia   . Arthritis     osteoarthritis    Past Surgical History  Procedure Date  . Esophagogastroduodenoscopy 2008 DIL DR. ORR    2010 DIL 17 MM SAV-->EGD/DIL RMR MAR 2012  . Thyroid surgery 89    partail thyroidectomy-[facility]  . Colon resection 1993 RIGHT    TERMINAL ILEITIS, NO GRANULOMAS/CRYPT DISTORTION  . 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 Colonoscopy NOV 2011     ILEOTCS-NL  neo-TI & COLON, Bx: nl q10 cm  . Colonoscopy NOV 2010 ?IBD> 10 YEARS    ILEOTCS-NL  neo-TI & COLON, Bx: nl q10 cm  . Back surgery 2003    [facility]  . Tonsillectomy age 23    [facility]  . Cholecystectomy     [facility]  . Tubal ligation age 66    [facility]  . Cataract extraction, bilateral   . Bravo ph study 01/09/2011    NON-ACID REFLUX V. NON-ULCER DYSPEPSIA    Allergies  Allergen Reactions  . Fentanyl Anaphylaxis  . Codeine Hives  . Imuran (Azathioprine) Other  (See Comments)    Chemically induced meningitis   . Penicillins Hives  . Sulfonamide Derivatives Nausea And Vomiting    Current Outpatient Prescriptions  Medication Sig Dispense Refill  . acidophilus (RISAQUAD) CAPS Take 1 capsule by mouth 2 (two) times daily.        Marland Kitchen ALPRAZolam (XANAX) 0.5 MG tablet Take 0.5 mg by mouth 3 (three) times daily.       Marland Kitchen aspirin 81 MG chewable tablet Chew 81 mg by mouth every morning.        Marland Kitchen buPROPion (WELLBUTRIN XL) 300 MG 24 hr tablet Take 300 mg by mouth every morning.       . calcium-vitamin D (OSCAL WITH D) 500-200 MG-UNIT per tablet Take 1 tablet by mouth 2 (two) times daily.        . carvedilol (COREG) 12.5 MG tablet Take 12.5 mg by mouth 2 (two) times daily with a meal.       . esomeprazole (NEXIUM) 40 MG capsule 1 po 30 minutes prior to meals twice daily    . fexofenadine (ALLEGRA) 180 MG tablet Take 180 mg by mouth daily as needed. For allergies    . FLUoxetine (PROZAC) 20 MG tablet Take  20 mg by mouth 3 (three) times daily.     Marland Kitchen JINTELI 1-5 MG-MCG TABS Take 1 tablet by mouth every morning.     Marland Kitchen losartan-hydrochlorothiazide (HYZAAR) 100-12.5 MG per tablet Take 1 tablet by mouth every morning.     . mesalamine (PENTASA) 250 MG CR capsule Take 3 capsules (750 mg total) by mouth 3 (three) times daily.    . montelukast (SINGULAIR) 10 MG tablet Take 10 mg by mouth at bedtime.     . pravastatin (PRAVACHOL) 80 MG tablet Take 80 mg by mouth at bedtime.     . promethazine (PHENERGAN) 25 MG tablet TAKE 1/2 TO 1 TABLET BY MOUTH EVERY 4 TO 6 HOURS AS NEEDED FOR NAUSEA OR VOMITTING USES DAILY   . sucralfate (CARAFATE) 1 GM/10ML suspension Take 1 g by mouth 3 (three) times daily as needed.    LAST DOSE > 6 MOS AGO    . traMADol (ULTRAM) 50 MG tablet Take 50 mg by mouth every 6 (six) hours as needed.       BACLOFEN AS NEEDED: 1-2X/MO   Review of Systems     Objective:   Physical Exam        Assessment & Plan:

## 2011-11-22 NOTE — Progress Notes (Signed)
Reminder in epic to follow up with SF in E30 in 6 months °

## 2011-12-06 ENCOUNTER — Telehealth: Payer: Self-pay | Admitting: Gastroenterology

## 2011-12-06 NOTE — Telephone Encounter (Signed)
Referral has been sent over to Dr. Fransico Him and patient is aware

## 2011-12-07 ENCOUNTER — Encounter: Payer: Self-pay | Admitting: Gastroenterology

## 2011-12-11 ENCOUNTER — Telehealth: Payer: Self-pay

## 2011-12-11 NOTE — Telephone Encounter (Signed)
Pt called to set up her TCS. She said it was going to be on Nov 4 but I do not see anything in the computer. Can you please call her to get her set up. She can be reached at both phone numbers.

## 2011-12-12 ENCOUNTER — Other Ambulatory Visit: Payer: Self-pay | Admitting: Gastroenterology

## 2011-12-12 MED ORDER — PEG 3350-KCL-NA BICARB-NACL 420 G PO SOLR: 4000.0000 mL | ORAL | Status: DC

## 2011-12-12 NOTE — Telephone Encounter (Signed)
Gastroenterology Pre-Procedure Form    Request Date: 12/11/2011       Requesting Physician: Dr. Darrick Penna     PATIENT INFORMATION:  Stacy Elliott is a 61 y.o., female (DOB=06-03-1950).  PROCEDURE: Procedure(s) requested: colonoscopy Procedure Reason: Surveillance  PATIENT REVIEW QUESTIONS: The patient reports the following:   1. Diabetes Melitis: no 2. Joint replacements in the past 12 months: no 3. Major health problems in the past 3 months: no 4. Has an artificial valve or MVP:no 5. Has been advised in past to take antibiotics in advance of a procedure like teeth cleaning: no}    MEDICATIONS & ALLERGIES:    Patient reports the following regarding taking any blood thinners:   Plavix? no Aspirin?yes  Coumadin?  no  Patient confirms/reports the following medications:  Current Outpatient Prescriptions  Medication Sig Dispense Refill  . ALPRAZolam (XANAX) 0.5 MG tablet Take 0.5 mg by mouth 3 (three) times daily.       Marland Kitchen aspirin 81 MG chewable tablet Chew 81 mg by mouth every morning.        Marland Kitchen buPROPion (WELLBUTRIN XL) 300 MG 24 hr tablet Take 300 mg by mouth every morning.       . calcium-vitamin D (OSCAL WITH D) 500-200 MG-UNIT per tablet Take 1 tablet by mouth 2 (two) times daily.        . carvedilol (COREG) 12.5 MG tablet Take 12.5 mg by mouth 2 (two) times daily with a meal.       . esomeprazole (NEXIUM) 40 MG capsule 1 po 30 minutes prior to meals twice daily  189 capsule  3  . fexofenadine (ALLEGRA) 180 MG tablet Take 180 mg by mouth daily as needed. For allergies      . FLUoxetine (PROZAC) 20 MG tablet Take 20 mg by mouth 3 (three) times daily.       Marland Kitchen JINTELI 1-5 MG-MCG TABS Take 1 tablet by mouth every morning.       Marland Kitchen losartan-hydrochlorothiazide (HYZAAR) 100-12.5 MG per tablet Take 1 tablet by mouth every morning.       . mesalamine (PENTASA) 250 MG CR capsule Take 3 capsules (750 mg total) by mouth 3 (three) times daily.  810 capsule  3  . montelukast (SINGULAIR) 10  MG tablet Take 10 mg by mouth at bedtime.       . pravastatin (PRAVACHOL) 80 MG tablet Take 80 mg by mouth at bedtime.       . promethazine (PHENERGAN) 25 MG tablet 1/2-1 PO Q4-6H PRN FOR NAUSEA OR VOMITING  62 tablet  5  . sucralfate (CARAFATE) 1 GM/10ML suspension Take 1 g by mouth 3 (three) times daily as needed.        Marland Kitchen acidophilus (RISAQUAD) CAPS Take 1 capsule by mouth 2 (two) times daily.        . traMADol (ULTRAM) 50 MG tablet Take 50 mg by mouth every 6 (six) hours as needed.        Patient confirms/reports the following allergies:  Allergies  Allergen Reactions  . Fentanyl Anaphylaxis  . Codeine Hives  . Imuran (Azathioprine) Other (See Comments)    Chemically induced meningitis   . Penicillins Hives  . Sulfonamide Derivatives Nausea And Vomiting    Patient is appropriate to schedule for requested procedure(s): yes  AUTHORIZATION INFORMATION Primary Insurance:   ID #: Group #:  Pre-Cert / Auth required: Pre-Cert / Auth #:  Secondary Insurance:   ID #:   Group #:  Pre-Cert /  Auth required: Pre-Cert / Auth #:   No orders of the defined types were placed in this encounter.    SCHEDULE INFORMATION: Procedure has been scheduled as follows:  Date: 01/02/2012    Time: 7:30 AM  Location: [facility] Short Stay  This Gastroenterology Pre-Precedure Form is being routed to the following provider(s) for review: Jonette Eva, MD

## 2011-12-12 NOTE — Telephone Encounter (Signed)
Routing to [employer]. She had pt on her schedule for 01/02/2012.

## 2011-12-12 NOTE — Telephone Encounter (Signed)
MOVI PREP SPLIT DOSING, REGULAR BREAKFAST. CLEAR LIQUIDS AFTER 9 AM.  

## 2011-12-18 ENCOUNTER — Encounter (HOSPITAL_COMMUNITY): Payer: Self-pay

## 2011-12-19 ENCOUNTER — Other Ambulatory Visit (HOSPITAL_COMMUNITY): Payer: Self-pay | Admitting: "Endocrinology

## 2011-12-19 DIAGNOSIS — E049 Nontoxic goiter, unspecified: Secondary | ICD-10-CM

## 2011-12-22 ENCOUNTER — Ambulatory Visit (HOSPITAL_COMMUNITY)
Admission: RE | Admit: 2011-12-22 | Discharge: 2011-12-22 | Disposition: A | Discharge status: Home / Self Care | Payer: Medicare Other | Source: Ambulatory Visit | Attending: "Endocrinology | Admitting: "Endocrinology

## 2011-12-22 DIAGNOSIS — E042 Nontoxic multinodular goiter: Secondary | ICD-10-CM | POA: Insufficient documentation

## 2011-12-22 DIAGNOSIS — E049 Nontoxic goiter, unspecified: Secondary | ICD-10-CM

## 2011-12-25 NOTE — Patient Instructions (Signed)
20 Stacy Elliott  12/25/2011   Your procedure is scheduled on:  01/02/12  Report to [facility] at 06:15 AM.  Call this number if you have problems the morning of surgery: [phone]   Remember:   Do not eat or drink:After Midnight.   Take these medicines the morning of surgery with A SIP OF WATER: Carvedilol, Nexium,   Losartan-HCTZ, Bupropion, Fluoxetine, and Xanax only if needed. Use your ProAir inhaler  also.   Do not wear jewelry, make-up or nail polish.  Do not wear lotions, powders, or perfumes. You may wear deodorant.  Do not shave 48 hours prior to surgery. Men may shave face and neck.  Do not bring valuables to the hospital.  Contacts, dentures or bridgework may not be worn into surgery.  Leave suitcase in the car. After surgery it may be brought to your room.   Patients discharged the day of surgery will not be allowed to drive home.    Special Instructions: Start your bowel prep as directed by your doctor.   Please read over the following fact sheets that you were given: Pain Booklet, Anesthesia Post-op Instructions and Care and Recovery After Surgery    Colonoscopy A colonoscopy is an exam to evaluate your entire colon. In this exam, your colon is cleansed. A long fiberoptic tube is inserted through your rectum and into your colon. The fiberoptic scope (endoscope) is a long bundle of enclosed and very flexible fibers. These fibers transmit light to the area examined and send images from that area to your caregiver. Discomfort is usually minimal. You may be given a drug to help you sleep (sedative) during or prior to the procedure. This exam helps to detect lumps (tumors), polyps, inflammation, and areas of bleeding. Your caregiver may also take a small piece of tissue (biopsy) that will be examined under a microscope. LET YOUR CAREGIVER KNOW ABOUT:   Allergies to food or medicine.  Medicines taken, including vitamins, herbs, eyedrops, over-the-counter medicines, and  creams.  Use of steroids (by mouth or creams).  Previous problems with anesthetics or numbing medicines.  History of bleeding problems or blood clots.  Previous surgery.  Other health problems, including diabetes and kidney problems.  Possibility of pregnancy, if this applies. BEFORE THE PROCEDURE   A clear liquid diet may be required for 2 days before the exam.  Ask your caregiver about changing or stopping your regular medications.  Liquid injections (enemas) or laxatives may be required.  A large amount of electrolyte solution may be given to you to drink over a short period of time. This solution is used to clean out your colon.  You should be present 60 minutes prior to your procedure or as directed by your caregiver. AFTER THE PROCEDURE   If you received a sedative or pain relieving medication, you will need to arrange for someone to drive you home.  Occasionally, there is a little blood passed with the first bowel movement. Do not be concerned. FINDING OUT THE RESULTS OF YOUR TEST Not all test results are available during your visit. If your test results are not back during the visit, make an appointment with your caregiver to find out the results. Do not assume everything is normal if you have not heard from your caregiver or the medical facility. It is important for you to follow up on all of your test results. HOME CARE INSTRUCTIONS   It is not unusual to pass moderate amounts of gas and experience mild  abdominal cramping following the procedure. This is due to air being used to inflate your colon during the exam. Walking or a warm pack on your belly (abdomen) may help.  You may resume all normal meals and activities after sedatives and medicines have worn off.  Only take over-the-counter or prescription medicines for pain, discomfort, or fever as directed by your caregiver. Do not use aspirin or blood thinners if a biopsy was taken. Consult your caregiver for medicine  usage if biopsies were taken. SEEK IMMEDIATE MEDICAL CARE IF:   You have a fever.  You pass large blood clots or fill a toilet with blood following the procedure. This may also occur 10 to 14 days following the procedure. This is more likely if a biopsy was taken.  You develop abdominal pain that keeps getting worse and cannot be relieved with medicine. Document Released: 02/11/2000 Document Revised: 05/08/2011 Document Reviewed: 09/26/2007 [facility] Patient Information 2013 [place], [place].    PATIENT INSTRUCTIONS POST-ANESTHESIA  IMMEDIATELY FOLLOWING SURGERY:  Do not drive or operate machinery for the first twenty four hours after surgery.  Do not make any important decisions for twenty four hours after surgery or while taking narcotic pain medications or sedatives.  If you develop intractable nausea and vomiting or a severe headache please notify your doctor immediately.  FOLLOW-UP:  Please make an appointment with your surgeon as instructed. You do not need to follow up with anesthesia unless specifically instructed to do so.  WOUND CARE INSTRUCTIONS (if applicable):  Keep a dry clean dressing on the anesthesia/puncture wound site if there is drainage.  Once the wound has quit draining you may leave it open to air.  Generally you should leave the bandage intact for twenty four hours unless there is drainage.  If the epidural site drains for more than 36-48 hours please call the anesthesia department.  QUESTIONS?:  Please feel free to call your physician or the hospital operator if you have any questions, and they will be happy to assist you.

## 2011-12-26 ENCOUNTER — Encounter (HOSPITAL_COMMUNITY)
Admission: RE | Admit: 2011-12-26 | Discharge: 2011-12-26 | Disposition: A | Discharge status: Home / Self Care | Payer: Medicare Other | Source: Ambulatory Visit | Attending: Gastroenterology | Admitting: Gastroenterology

## 2011-12-26 ENCOUNTER — Encounter (HOSPITAL_COMMUNITY): Payer: Self-pay

## 2011-12-26 HISTORY — DX: Dizziness and giddiness: R42

## 2011-12-26 LAB — BASIC METABOLIC PANEL
BUN: 11 mg/dL (ref 6–23)
Calcium: 9.8 mg/dL (ref 8.4–10.5)
Creatinine, Ser: 0.75 mg/dL (ref 0.50–1.10)
GFR calc Af Amer: >90 mL/min (ref >90)

## 2011-12-26 LAB — HEMOGLOBIN AND HEMATOCRIT, BLOOD: Hemoglobin: 14.2 g/dL (ref 12.0–15.0)

## 2012-01-02 ENCOUNTER — Ambulatory Visit (HOSPITAL_COMMUNITY): Payer: Medicare Other | Admitting: Anesthesiology

## 2012-01-02 ENCOUNTER — Encounter (HOSPITAL_COMMUNITY)
Admission: RE | Disposition: A | Discharge status: Home / Self Care | Payer: Self-pay | Source: Ambulatory Visit | Attending: Gastroenterology

## 2012-01-02 ENCOUNTER — Encounter (HOSPITAL_COMMUNITY): Payer: Self-pay | Admitting: *Deleted

## 2012-01-02 ENCOUNTER — Encounter (HOSPITAL_COMMUNITY): Payer: Self-pay | Admitting: Anesthesiology

## 2012-01-02 ENCOUNTER — Ambulatory Visit (HOSPITAL_COMMUNITY)
Admission: RE | Admit: 2012-01-02 | Discharge: 2012-01-02 | Disposition: A | Discharge status: Home / Self Care | Payer: Medicare Other | Source: Ambulatory Visit | Attending: Gastroenterology | Admitting: Gastroenterology

## 2012-01-02 DIAGNOSIS — Z1211 Encounter for screening for malignant neoplasm of colon: Secondary | ICD-10-CM | POA: Insufficient documentation

## 2012-01-02 DIAGNOSIS — K5090 Crohn's disease, unspecified, without complications: Secondary | ICD-10-CM

## 2012-01-02 DIAGNOSIS — Z01812 Encounter for preprocedural laboratory examination: Secondary | ICD-10-CM | POA: Insufficient documentation

## 2012-01-02 DIAGNOSIS — D126 Benign neoplasm of colon, unspecified: Secondary | ICD-10-CM | POA: Insufficient documentation

## 2012-01-02 DIAGNOSIS — K633 Ulcer of intestine: Secondary | ICD-10-CM | POA: Insufficient documentation

## 2012-01-02 DIAGNOSIS — I10 Essential (primary) hypertension: Secondary | ICD-10-CM | POA: Insufficient documentation

## 2012-01-02 HISTORY — PX: COLONOSCOPY WITH PROPOFOL: SHX5780

## 2012-01-02 SURGERY — COLONOSCOPY WITH PROPOFOL
Anesthesia: Monitor Anesthesia Care

## 2012-01-02 MED ORDER — MIDAZOLAM HCL 2 MG/2ML IJ SOLN
INTRAMUSCULAR | Status: AC
  Filled 2012-01-02: qty 2

## 2012-01-02 MED ORDER — ONDANSETRON HCL 4 MG/2ML IJ SOLN: 4.0000 mg | Freq: Once | INTRAMUSCULAR | Status: DC | PRN

## 2012-01-02 MED ORDER — STERILE WATER FOR IRRIGATION IR SOLN
Status: DC | PRN
  Administered 2012-01-02: 08:00:00

## 2012-01-02 MED ORDER — GLYCOPYRROLATE 0.2 MG/ML IJ SOLN
0.2000 mg | Freq: Once | INTRAMUSCULAR | Status: AC
  Administered 2012-01-02: 0.2 mg via INTRAVENOUS

## 2012-01-02 MED ORDER — LIDOCAINE HCL 1 % IJ SOLN
INTRAMUSCULAR | Status: DC | PRN
  Administered 2012-01-02: 50 mg via INTRADERMAL

## 2012-01-02 MED ORDER — ONDANSETRON HCL 4 MG/2ML IJ SOLN
4.0000 mg | Freq: Once | INTRAMUSCULAR | Status: AC
  Administered 2012-01-02: 4 mg via INTRAVENOUS

## 2012-01-02 MED ORDER — MIDAZOLAM HCL 2 MG/2ML IJ SOLN
1.0000 mg | INTRAMUSCULAR | Status: DC | PRN
  Administered 2012-01-02 (×2): 2 mg via INTRAVENOUS

## 2012-01-02 MED ORDER — LACTATED RINGERS IV SOLN
INTRAVENOUS | Status: DC
  Administered 2012-01-02: 07:00:00 via INTRAVENOUS

## 2012-01-02 MED ORDER — PROPOFOL INFUSION 10 MG/ML OPTIME
INTRAVENOUS | Status: DC | PRN
  Administered 2012-01-02: 50 ug/kg/min via INTRAVENOUS

## 2012-01-02 MED ORDER — WATER FOR IRRIGATION, STERILE IR SOLN
Status: DC | PRN
  Administered 2012-01-02: 1000 mL

## 2012-01-02 MED ORDER — PROPOFOL 10 MG/ML IV EMUL
INTRAVENOUS | Status: AC
  Filled 2012-01-02: qty 20

## 2012-01-02 MED ORDER — MIDAZOLAM HCL 5 MG/5ML IJ SOLN
INTRAMUSCULAR | Status: DC | PRN
  Administered 2012-01-02 (×2): 2 mg via INTRAVENOUS

## 2012-01-02 MED ORDER — GLYCOPYRROLATE 0.2 MG/ML IJ SOLN
INTRAMUSCULAR | Status: AC
  Filled 2012-01-02: qty 1

## 2012-01-02 MED ORDER — ONDANSETRON HCL 4 MG/2ML IJ SOLN
INTRAMUSCULAR | Status: AC
  Filled 2012-01-02: qty 2

## 2012-01-02 MED ORDER — FENTANYL CITRATE 0.05 MG/ML IJ SOLN: 25.0000 ug | INTRAMUSCULAR | Status: DC | PRN

## 2012-01-02 MED ORDER — LIDOCAINE HCL (PF) 1 % IJ SOLN
INTRAMUSCULAR | Status: AC
  Filled 2012-01-02: qty 5

## 2012-01-02 SURGICAL SUPPLY — 21 items
ELECT REM PT RETURN 9FT ADLT (ELECTROSURGICAL)
ELECTRODE REM PT RTRN 9FT ADLT (ELECTROSURGICAL) IMPLANT
FCP BXJMBJMB 240X2.8X (CUTTING FORCEPS)
FLOOR PAD 36X40 (MISCELLANEOUS) ×2
FORCEPS BIOP RAD 4 LRG CAP 4 (CUTTING FORCEPS) ×2 IMPLANT
FORCEPS BIOP RJ4 240 W/NDL (CUTTING FORCEPS)
FORCEPS BXJMBJMB 240X2.8X (CUTTING FORCEPS) IMPLANT
INJECTOR/SNARE I SNARE (MISCELLANEOUS) IMPLANT
LUBRICANT JELLY 4.5OZ STERILE (MISCELLANEOUS) ×1 IMPLANT
MANIFOLD NEPTUNE II (INSTRUMENTS) ×1 IMPLANT
NEEDLE SCLEROTHERAPY 25GX240 (NEEDLE) IMPLANT
PAD FLOOR 36X40 (MISCELLANEOUS) ×1 IMPLANT
PROBE APC STR FIRE (PROBE) IMPLANT
PROBE INJECTION GOLD (MISCELLANEOUS)
PROBE INJECTION GOLD 7FR (MISCELLANEOUS) IMPLANT
SNARE SHORT THROW 13M SML OVAL (MISCELLANEOUS) ×2 IMPLANT
SYR 50ML LL SCALE MARK (SYRINGE) ×1 IMPLANT
TRAP SPECIMEN MUCOUS 40CC (MISCELLANEOUS) IMPLANT
TUBING ENDO SMARTCAP PENTAX (MISCELLANEOUS) ×1 IMPLANT
TUBING IRRIGATION ENDOGATOR (MISCELLANEOUS) IMPLANT
WATER STERILE IRR 1000ML POUR (IV SOLUTION) ×2 IMPLANT

## 2012-01-02 NOTE — Progress Notes (Signed)
Passing air.

## 2012-01-02 NOTE — Anesthesia Postprocedure Evaluation (Signed)
  Anesthesia Post-op Note  Patient: Stacy Elliott  Procedure(s) Performed: Procedure(s) (LRB) with comments: COLONOSCOPY WITH PROPOFOL (N/A) - in ileum at 0821  Patient Location: PACU  Anesthesia Type:MAC  Level of Consciousness: awake, alert , oriented and patient cooperative  Airway and Oxygen Therapy: Patient Spontanous Breathing  Post-op Pain: none  Post-op Assessment: Post-op Vital signs reviewed, Patient's Cardiovascular Status Stable, Respiratory Function Stable, Patent Airway, No signs of Nausea or vomiting and Pain level controlled  Post-op Vital Signs: Reviewed and stable  Complications: No apparent anesthesia complications

## 2012-01-02 NOTE — Anesthesia Preprocedure Evaluation (Signed)
Anesthesia Evaluation  Patient identified by MRN, date of birth, ID band Patient awake    Reviewed: Allergy & Precautions, H&P , NPO status , Patient's Chart, lab work & pertinent test results, reviewed documented beta blocker date and time   History of Anesthesia Complications (+) PONV  Airway Mallampati: II      Dental  (+) Teeth Intact   Pulmonary asthma ,    Pulmonary exam normal       Cardiovascular hypertension, Pt. on medications Rhythm:Regular Rate:Normal     Neuro/Psych PSYCHIATRIC DISORDERS Anxiety Depression    GI/Hepatic hiatal hernia, GERD-  Medicated,  Endo/Other  Hypothyroidism   Renal/GU      Musculoskeletal   Abdominal   Peds  Hematology   Anesthesia Other Findings   Reproductive/Obstetrics                           Anesthesia Physical Anesthesia Plan  ASA: III  Anesthesia Plan: MAC   Post-op Pain Management:    Induction:   Airway Management Planned: Simple Face Mask  Additional Equipment:   Intra-op Plan:   Post-operative Plan:   Informed Consent: I have reviewed the patients History and Physical, chart, labs and discussed the procedure including the risks, benefits and alternatives for the proposed anesthesia with the patient or authorized representative who has indicated his/her understanding and acceptance.     Plan Discussed with:   Anesthesia Plan Comments: (Allergic to fentanyl PATCH only.)        Anesthesia Quick Evaluation

## 2012-01-02 NOTE — Progress Notes (Signed)
Arousing. Oriented to place per nurse. Encouraged to pass air.

## 2012-01-02 NOTE — Op Note (Addendum)
[facility] [address] [place], [address]   COLONOSCOPY PROCEDURE REPORT  PATIENT: Stacy Elliott, Stacy Elliott  MR#: [id number] BIRTHDATE: 1950-04-25 , 61  yrs. old GENDER: Female ENDOSCOPIST: Jonette Eva, MD REFERRED OZ:HYQMV Milam, M.D. PROCEDURE DATE:  01/02/2012 PROCEDURE:   ILEOColonoscopy with biopsy INDICATIONS:high risk patient with previously diagnosed Crohn's disease 8+ years duration. ORIGINAL PATH: NO GRANULOMAS. PT MAINTAINED ON MESALAMINE.  MEDICATIONS: MAC sedation, administered by CRNA  DESCRIPTION OF PROCEDURE:    Physical exam was performed.  Informed consent was obtained from the patient after explaining the benefits, risks, and alternatives to procedure.  The patient was connected to monitor and placed in left lateral position. Continuous oxygen was provided by nasal cannula and IV medicine administered through an indwelling cannula.  After administration of sedation and rectal exam, the patients rectum was intubated and the     colonoscope was advanced under direct visualization to the ileum.  The scope was removed slowly by carefully examining the color, texture, anatomy, and integrity mucosa on the way out.  The patient was recovered in endoscopy and discharged home in satisfactory condition.       COLON FINDINGS: The mucosa appeared normal in the terminal ileum.  , ONE 3 MM CLEAN BASED ULCER AT ANATAMOSIS. The colon was otherwise normal.  COLD FORCEPS BIOPSIES OBTAINED EVERY 10 CM BEGINNING 70 CM FROM THE ANAL VERGE. There was no diverticulosis, inflammation, polyps or cancers unless previously stated.  , and NO INTERNAL HEMORRHOIDS.  PREP QUALITY: good. W/D TIME: 13 minutes  FROM TEH ANASTOMOSIS  COMPLICATIONS: None  ENDOSCOPIC IMPRESSION: 1.   Normal mucosa in the terminal ileum 2.   SMALL ULCER AT ANASTOMOSISIS. The colon was otherwise normal 3.   NO INTERNAL HEMORRHOIDS   RECOMMENDATIONS: 1.  Await biopsy results 2.   HIGH FIBER DIET OPV IN MAR 2014 TCS IN 1-3 YEARS WITH OVERTUBE & MAC       _______________________________ Rosalie DoctorJonette Eva, MD 01/02/2012 9:02 AM Revised: 01/02/2012 9:02 AM    PATIENT NAME:  Stacy Elliott MR#: [id number]

## 2012-01-02 NOTE — Transfer of Care (Signed)
Immediate Anesthesia Transfer of Care Note  Patient: Stacy Elliott  Procedure(s) Performed: Procedure(s) (LRB) with comments: COLONOSCOPY WITH PROPOFOL (N/A) - in ileum at 0821  Patient Location: PACU  Anesthesia Type:MAC  Level of Consciousness: awake and patient cooperative  Airway & Oxygen Therapy: Patient Spontanous Breathing and Patient connected to face mask oxygen  Post-op Assessment: Report given to PACU RN, Post -op Vital signs reviewed and stable and Patient moving all extremities  Post vital signs: Reviewed and stable  Complications: No apparent anesthesia complications

## 2012-01-02 NOTE — H&P (Signed)
Primary Care Physician:  Zachery Dauer, MD Primary Gastroenterologist:  Dr. Darrick Penna  Pre-Procedure History & Physical: HPI:  Stacy Elliott is a 61 y.o. female here for IBD SCREENING.  Past Medical History  Diagnosis Date  . Mouth sores   . Dysphagia 2002 BASW-SML HH/GERD  . GERD (gastroesophageal reflux disease)   . Anxiety disorder   . Depression   . Hyperlipemia   . Hypertension   . Hypothyroidism   . Ileitis, terminal 1993  . History of MRSA infection 2011 NOSE  . Obesity (BMI 30-39.9) 2011 200 LBS    MAR 2012 147 LBS  . Diarrhea INTERMITTENT    JAN 2010 HBT SIBO (1pk 26 ppm), ABX-JAN & MAY 2010  . Inflammatory bowel disease 1993 ?CHRON'S ILEITIS    PATH: NO GRANULOMAS OR CRYPT DISTORTION, NL COLON  . Irritable bowel syndrome DIARRHEA  . Asthma   . PONV (postoperative nausea and vomiting)   . Hiatal hernia   . Arthritis     osteoarthritis  . Vertigo     Past Surgical History  Procedure Date  . Esophagogastroduodenoscopy 2008 DIL DR. ORR    2010 DIL 17 MM SAV-->EGD/DIL RMR MAR 2012  . Thyroid surgery 89    partail thyroidectomy-[place]  . Colon resection 1993 RIGHT    TERMINAL ILEITIS, NO GRANULOMAS/CRYPT DISTORTION  . 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 Colonoscopy NOV 2011     ILEOTCS-NL  neo-TI & COLON, Bx: nl q10 cm  . Colonoscopy NOV 2010 ?IBD> 10 YEARS    ILEOTCS-NL  neo-TI & COLON, Bx: nl q10 cm  . Back surgery 2003    [place]  . Tonsillectomy age 51    [facility]  . Cholecystectomy     [place]  . Tubal ligation age 72    [facility]  . Cataract extraction, bilateral     bilateral  . Bravo ph study 01/09/2011    NON-ACID REFLUX V. NON-ULCER DYSPEPSIA    Prior to Admission medications   Medication Sig Start Date End Date Taking? Authorizing Provider  ALPRAZolam Prudy Feeler) 0.5 MG tablet Take 0.5 mg by mouth 3 (three) times daily.  12/08/10  Yes Historical Provider, MD  buPROPion (WELLBUTRIN XL)  300 MG 24 hr tablet Take 300 mg by mouth daily.  12/28/10  Yes Historical Provider, MD  calcium-vitamin D (OSCAL WITH D) 500-200 MG-UNIT per tablet Take 1 tablet by mouth 2 (two) times daily.     Yes Historical Provider, MD  carvedilol (COREG) 12.5 MG tablet Take 12.5 mg by mouth 2 (two) times daily.    Yes Historical Provider, MD  esomeprazole (NEXIUM) 40 MG capsule Take 40 mg by mouth 2 (two) times daily.   Yes Historical Provider, MD  FLUoxetine (PROZAC) 20 MG tablet Take 20 mg by mouth 3 (three) times daily.    Yes Historical Provider, MD  JINTELI 1-5 MG-MCG TABS Take 1 tablet by mouth daily.  09/18/10  Yes Historical Provider, MD  losartan-hydrochlorothiazide (HYZAAR) 100-12.5 MG per tablet Take 1 tablet by mouth daily.    Yes Historical Provider, MD  mesalamine (PENTASA) 250 MG CR capsule Take 3 capsules (750 mg total) by mouth 3 (three) times daily. 11/15/11  Yes West Bali, MD  montelukast (SINGULAIR) 10 MG tablet Take 10 mg by mouth at bedtime.    Yes Historical Provider, MD  polyethylene glycol-electrolytes (TRILYTE) 420 G solution Take 4,000 mLs by mouth as directed. 12/12/11  Yes Tierany Appleby L Damiel Barthold,  MD  pravastatin (PRAVACHOL) 80 MG tablet Take 80 mg by mouth at bedtime.  10/03/10  Yes Historical Provider, MD  PROAIR HFA 108 (90 BASE) MCG/ACT inhaler Inhale 2 puffs into the lungs Every 6 hours as needed. For shortness of breath 09/13/11  Yes Historical Provider, MD  Probiotic Product (PROBIOTIC DAILY) CAPS Take 1 capsule by mouth daily. OTC - exact product varies   Yes Historical Provider, MD  promethazine (PHENERGAN) 25 MG tablet Take 25 mg by mouth 2 (two) times daily.   Yes Historical Provider, MD  aspirin 81 MG chewable tablet Chew 81 mg by mouth daily.     Historical Provider, MD  fexofenadine (ALLEGRA) 180 MG tablet Take 180 mg by mouth daily as needed. For allergies    Historical Provider, MD  polyethylene glycol-electrolytes (TRILYTE) 420 G solution Take 4,000 mLs by mouth as directed.  12/12/11   West Bali, MD  sucralfate (CARAFATE) 1 GM/10ML suspension Take 1 g by mouth 3 (three) times daily as needed. For upset stomach    Historical Provider, MD    Allergies as of 12/12/2011 - Review Complete 12/12/2011  Allergen Reaction Noted  . Fentanyl Anaphylaxis   . Codeine Hives   . Imuran (azathioprine) Other (See Comments) 01/02/2011  . Penicillins Hives   . Sulfonamide derivatives Nausea And Vomiting     Family History  Problem Relation Age of Onset  . Anesthesia problems Neg Hx   . Hypotension Neg Hx   . Malignant hyperthermia Neg Hx   . Pseudochol deficiency Neg Hx     History   Social History  . Marital Status: Widowed    Spouse Name: N/A    Number of Children: N/A  . Years of Education: N/A   Occupational History  . Not on file.   Social History Main Topics  . Smoking status: Never Smoker   . Smokeless tobacco: Not on file  . Alcohol Use: No  . Drug Use: No  . Sexually Active: Yes    Birth Control/ Protection: Post-menopausal   Other Topics Concern  . Not on file   Social History Narrative  . No narrative on file    Review of Systems: See HPI, otherwise negative ROS   Physical Exam: BP 149/82  Pulse 76  Temp 97.8 F (36.6 C) (Oral)  Resp 24  SpO2 96% General:   Alert,  pleasant and cooperative in NAD Head:  Normocephalic and atraumatic. Neck:  Supple; Lungs:  Clear throughout to auscultation.    Heart:  Regular rate and rhythm. Abdomen:  Soft, nontender and nondistended. Normal bowel sounds, without guarding, and without rebound.   Neurologic:  Alert and  oriented x4;  grossly normal neurologically.  Impression/Plan:     SCREENING:UC/CROHN'S COLITIS  Plan:  1. TCS TODAY

## 2012-01-03 ENCOUNTER — Encounter (HOSPITAL_COMMUNITY): Payer: Self-pay | Admitting: Gastroenterology

## 2012-01-04 ENCOUNTER — Telehealth: Payer: Self-pay | Admitting: Gastroenterology

## 2012-01-04 DIAGNOSIS — K5090 Crohn's disease, unspecified, without complications: Secondary | ICD-10-CM

## 2012-01-04 NOTE — Telephone Encounter (Signed)
Path faxed to PCP, recall made 

## 2012-01-04 NOTE — Telephone Encounter (Signed)
PLEASE CALL PT.  SHE HAD A 3 MM BENIGN ULCER AT THE ANASTOMOSIS AND HER COLON BX ARE NORMAL. REPEAT TCS IN 1-2 YEARS.

## 2012-01-04 NOTE — Telephone Encounter (Signed)
Called and informed pt.  

## 2012-05-02 ENCOUNTER — Encounter: Payer: Self-pay | Admitting: *Deleted

## 2012-06-26 ENCOUNTER — Ambulatory Visit (INDEPENDENT_AMBULATORY_CARE_PROVIDER_SITE_OTHER): Payer: Medicare Other | Admitting: Gastroenterology

## 2012-06-26 ENCOUNTER — Encounter: Payer: Self-pay | Admitting: Gastroenterology

## 2012-06-26 VITALS — Ht 60.0 in | Wt 202.0 lb

## 2012-06-26 DIAGNOSIS — K5090 Crohn's disease, unspecified, without complications: Secondary | ICD-10-CM

## 2012-06-26 MED ORDER — MESALAMINE ER 250 MG PO CPCR: 750.0000 mg | ORAL_CAPSULE | Freq: Three times a day (TID) | ORAL | Status: DC

## 2012-06-26 NOTE — Assessment & Plan Note (Addendum)
SX CONTROLLED.  CONTINUE PENTASA. REFILL X 1 YEAR. CONTINUE BRAN. OPV IN 6 MOS.

## 2012-06-26 NOTE — Progress Notes (Signed)
Cc PCP 

## 2012-06-26 NOTE — Patient Instructions (Signed)
CONTINUE BRAN AND PENTASA.  FOLLOW UP IN 6 MOS.

## 2012-06-26 NOTE — Progress Notes (Signed)
Subjective:    Patient ID: Stacy Elliott, female    DOB: January 10, 1951, 62 y.o.   MRN: [id number]  PCP: [facility]  HPI 7 WEEKS AGO PIPE BURST AND FLOODED BASEMENT. FINALLY PULLED OUT FLOORS. BEEN SICK.  GOT SINUSITIS & VERTIGO. GOT UP FRI AM. WAS BAD HA, SWOLLEN, SPEECH SLURRED, AND SHE FELL. BP UP. AND JUST GOT OUT OF THE HOSPITAL. STILL DOESN'T FEEL GOOD. DID SEE THYROID DOCTOR. TAKING PENTASA 3 PO TID. IF EATS BRAN BOWELS DOING WELL. BMs: NONE OVER THE WEEKEND, TODAY(5). HAVING PROBLEMS WITH ULCERS IN HER MOUTH OFF AND ON. USING MMW.,   Past Medical History  Diagnosis Date  . Mouth sores   . Dysphagia 2002 BASW-SML HH/GERD  . GERD (gastroesophageal reflux disease)   . Anxiety disorder   . Depression   . Hyperlipemia   . Hypertension   . Hypothyroidism   . Ileitis, terminal 1993  . History of MRSA infection 2011 NOSE  . Obesity (BMI 30-39.9) 2011 200 LBS    MAR 2012 147 LBS  . Diarrhea INTERMITTENT    JAN 2010 HBT SIBO (1pk 26 ppm), ABX-JAN & MAY 2010  . Inflammatory bowel disease 1993 ?CHRON'S ILEITIS    PATH: NO GRANULOMAS OR CRYPT DISTORTION, NL COLON  . Irritable bowel syndrome DIARRHEA  . Asthma   . PONV (postoperative nausea and vomiting)   . Hiatal hernia   . Arthritis     osteoarthritis  . Vertigo    Past Surgical History  Procedure Laterality Date  . Esophagogastroduodenoscopy  2008 DIL DR. ORR    2010 DIL 17 MM SAV-->EGD/DIL RMR MAR 2012  . Thyroid surgery  89    partail thyroidectomy-[place]  . Colon resection  1993 RIGHT    TERMINAL ILEITIS, NO GRANULOMAS/CRYPT DISTORTION  . 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 Colonoscopy  NOV 2011     ILEOTCS-NL  neo-TI & COLON, Bx: nl q10 cm  . Colonoscopy  NOV 2010 ?IBD> 10 YEARS    ILEOTCS-NL  neo-TI & COLON, Bx: nl q10 cm  . Back surgery  2003    [place]  . Tonsillectomy  age 42    [facility]  . Cholecystectomy      [place]  . Tubal ligation  age 22   [facility]  . Cataract extraction, bilateral      bilateral  . Bravo ph study  01/09/2011    NON-ACID REFLUX V. NON-ULCER DYSPEPSIA  . Colonoscopy with propofol  01/02/2012    Procedure: COLONOSCOPY WITH PROPOFOL;  Surgeon: West Bali, MD;  Location: [facility];  Service: Endoscopy;  Laterality: N/A;  in ileum at 0821   Allergies  Allergen Reactions  . Fentanyl Anaphylaxis  . Codeine Hives  . Imuran (Azathioprine) Other (See Comments)    Chemically induced meningitis   . Penicillins Hives  . Sulfonamide Derivatives Nausea And Vomiting    Current Outpatient Prescriptions  Medication Sig Dispense Refill  . ALPRAZolam (XANAX) 0.5 MG tablet Take 0.5 mg by mouth 3 (three) times daily.       Marland Kitchen aspirin 81 MG chewable tablet Chew 81 mg by mouth daily.       Marland Kitchen buPROPion (WELLBUTRIN XL) 300 MG 24 hr tablet Take 300 mg by mouth daily.       . calcium-vitamin D (OSCAL WITH D) 500-200 MG-UNIT per tablet Take 1 tablet by mouth 2 (two) times daily.        . carvedilol (  COREG) 12.5 MG tablet Take 12.5 mg by mouth 2 (two) times daily.       Marland Kitchen esomeprazole (NEXIUM) 40 MG capsule Take 40 mg by mouth 2 (two) times daily.      . fexofenadine (ALLEGRA) 180 MG tablet Take 180 mg by mouth daily as needed. For allergies      . JINTELI 1-5 MG-MCG TABS Take 1 tablet by mouth daily.       Marland Kitchen losartan-hydrochlorothiazide (HYZAAR) 100-12.5 MG per tablet Take 1 tablet by mouth daily.       . mesalamine (PENTASA) 250 MG CR capsule Take 3 capsules (750 mg total) by mouth 3 (three) times daily.  810 capsule  3  . montelukast (SINGULAIR) 10 MG tablet Take 10 mg by mouth at bedtime.       . pravastatin (PRAVACHOL) 80 MG tablet Take 80 mg by mouth at bedtime.       Marland Kitchen PROAIR HFA 108 (90 BASE) MCG/ACT inhaler Inhale 2 puffs into the lungs Every 6 hours as needed. For shortness of breath      . Probiotic Product (PROBIOTIC DAILY) CAPS Take 1 capsule by mouth daily. OTC - exact product varies      . promethazine (PHENERGAN) 25  MG tablet Take 25 mg by mouth 2 (two) times daily.      . sucralfate (CARAFATE) 1 GM/10ML suspension Take 1 g by mouth 3 (three) times daily as needed. For upset stomach      . venlafaxine XR (EFFEXOR-XR) 150 MG 24 hr capsule Take 150 mg by mouth daily.       Marland Kitchen FLUoxetine (PROZAC) 20 MG tablet Take 20 mg by mouth 3 (three) times daily.          Review of Systems     Objective:   Physical Exam  Vitals reviewed. Constitutional: She is oriented to person, place, and time. She appears well-nourished. No distress.  HENT:  Head: Normocephalic and atraumatic.  Mouth/Throat: Oropharynx is clear and moist. No oropharyngeal exudate.  Eyes: Pupils are equal, round, and reactive to light. No scleral icterus.  Neck: Normal range of motion. Neck supple.  Cardiovascular: Normal rate, regular rhythm and normal heart sounds.   Pulmonary/Chest: Effort normal and breath sounds normal. No respiratory distress.  Abdominal: Soft. Bowel sounds are normal. She exhibits no distension. There is no tenderness.  Musculoskeletal: She exhibits no edema.  Lymphadenopathy:    She has no cervical adenopathy.  Neurological: She is alert and oriented to person, place, and time.  NO FOCAL DEFICITS   Psychiatric: She has a normal mood and affect.          Assessment & Plan:

## 2012-06-28 NOTE — Progress Notes (Signed)
Reminder in epic °

## 2012-09-20 ENCOUNTER — Other Ambulatory Visit: Payer: Self-pay | Admitting: Gastroenterology

## 2012-09-30 ENCOUNTER — Telehealth: Payer: Self-pay | Admitting: *Deleted

## 2012-09-30 MED ORDER — DICYCLOMINE HCL 10 MG PO CAPS: 10.0000 mg | ORAL_CAPSULE | Freq: Three times a day (TID) | ORAL | Status: DC

## 2012-09-30 NOTE — Telephone Encounter (Signed)
Called pt. She said her stomach has been hurting some for about a week. It hurts in the lower abdomen into right side and radiates to back. She has been nauseated some but only vomited once. She is drinking liquids, and stays thirsty. She goes to the bathroom immediately after eating, and stool is not diarrhea but not formed. She's had a plain biscuit and a couple of crackers today. Please advise!

## 2012-09-30 NOTE — Telephone Encounter (Signed)
Pt has chromes diease, pt is having lower abd. Pain, please advise. [phone]

## 2012-09-30 NOTE — Telephone Encounter (Signed)
Doesn't sound like Crohn's flare. After review of record, she has a history of SBBO and has done well with courses of Cipro/Flagyl. Also question of IBS-D.   I'd like to trial low-dose Bentyl. If no improvement with this in the next few days, let us know. Contact us if any fever, chills, rectal bleeding, worsening abdominal pain.

## 2012-10-01 NOTE — Telephone Encounter (Signed)
Called and informed pt.  

## 2012-11-15 ENCOUNTER — Other Ambulatory Visit (HOSPITAL_COMMUNITY): Payer: Self-pay | Admitting: "Endocrinology

## 2012-11-15 DIAGNOSIS — E049 Nontoxic goiter, unspecified: Secondary | ICD-10-CM

## 2012-12-04 ENCOUNTER — Encounter: Payer: Self-pay | Admitting: Gastroenterology

## 2012-12-04 ENCOUNTER — Ambulatory Visit (INDEPENDENT_AMBULATORY_CARE_PROVIDER_SITE_OTHER): Payer: Medicare Other | Admitting: Gastroenterology

## 2012-12-04 VITALS — BP 118/77 | HR 77 | Temp 98.3°F | Ht 60.0 in | Wt 196.2 lb

## 2012-12-04 DIAGNOSIS — K219 Gastro-esophageal reflux disease without esophagitis: Secondary | ICD-10-CM

## 2012-12-04 DIAGNOSIS — R1310 Dysphagia, unspecified: Secondary | ICD-10-CM

## 2012-12-04 DIAGNOSIS — K5090 Crohn's disease, unspecified, without complications: Secondary | ICD-10-CM

## 2012-12-04 MED ORDER — PROMETHAZINE HCL 25 MG PO TABS: ORAL_TABLET | ORAL | Status: DC

## 2012-12-04 MED ORDER — ESOMEPRAZOLE MAGNESIUM 40 MG PO CPDR: 40.0000 mg | DELAYED_RELEASE_CAPSULE | Freq: Two times a day (BID) | ORAL | Status: DC

## 2012-12-04 MED ORDER — MESALAMINE ER 250 MG PO CPCR: 750.0000 mg | ORAL_CAPSULE | Freq: Three times a day (TID) | ORAL | Status: DC

## 2012-12-04 NOTE — Patient Instructions (Signed)
CONTINUE YOUR WEIGHT LOSS EFFORTS. CONGRATULATIONS ON THE PROGRESS YOU HAVE MADE.  CONTINUE NEXIUM. TAKE 30 MINUTES BEFORE MEALS.  FOLLOW A LOW FAT DIET. SEE INFO BELOW.  CONTINUE PENTASA.  OPV IN 6 MOS.  Low-Fat Diet BREADS, CEREALS, PASTA, RICE, DRIED PEAS, AND BEANS These products are high in carbohydrates and most are low in fat. Therefore, they can be increased in the diet as substitutes for fatty foods. They too, however, contain calories and should not be eaten in excess. Cereals can be eaten for snacks as well as for breakfast.   FRUITS AND VEGETABLES It is good to eat fruits and vegetables. Besides being sources of fiber, both are rich in vitamins and some minerals. They help you get the daily allowances of these nutrients. Fruits and vegetables can be used for snacks and desserts.  MEATS Limit lean meat, chicken, [place], and fish to no more than 6 ounces per day. Beef, Pork, and Lamb Use lean cuts of beef, pork, and lamb. Lean cuts include:  Extra-lean ground beef.  Arm roast.  Sirloin tip.  Center-cut ham.  Round steak.  Loin chops.  Rump roast.  Tenderloin.  Trim all fat off the outside of meats before cooking. It is not necessary to severely decrease the intake of red meat, but lean choices should be made. Lean meat is rich in protein and contains a highly absorbable form of iron. Premenopausal women, in particular, should avoid reducing lean red meat because this could increase the risk for low red blood cells (iron-deficiency anemia).  Chicken and [place] These are good sources of protein. The fat of poultry can be reduced by removing the skin and underlying fat layers before cooking. Chicken and [place] can be substituted for lean red meat in the diet. Poultry should not be fried or covered with high-fat sauces. Fish and Shellfish Fish is a good source of protein. Shellfish contain cholesterol, but they usually are low in saturated fatty acids. The preparation of fish  is important. Like chicken and [place], they should not be fried or covered with high-fat sauces. EGGS Egg whites contain no fat or cholesterol. They can be eaten often. Try 1 to 2 egg whites instead of whole eggs in recipes or use egg substitutes that do not contain yolk. MILK AND DAIRY PRODUCTS Use skim or 1% milk instead of 2% or whole milk. Decrease whole milk, natural, and processed cheeses. Use nonfat or low-fat (2%) cottage cheese or low-fat cheeses made from vegetable oils. Choose nonfat or low-fat (1 to 2%) yogurt. Experiment with evaporated skim milk in recipes that call for heavy cream. Substitute low-fat yogurt or low-fat cottage cheese for sour cream in dips and salad dressings. Have at least 2 servings of low-fat dairy products, such as 2 glasses of skim (or 1%) milk each day to help get your daily calcium intake. FATS AND OILS Reduce the total intake of fats, especially saturated fat. Butterfat, lard, and beef fats are high in saturated fat and cholesterol. These should be avoided as much as possible. Vegetable fats do not contain cholesterol, but certain vegetable fats, such as coconut oil, palm oil, and palm kernel oil are very high in saturated fats. These should be limited. These fats are often used in bakery goods, processed foods, popcorn, oils, and nondairy creamers. Vegetable shortenings and some peanut butters contain hydrogenated oils, which are also saturated fats. Read the labels on these foods and check for saturated vegetable oils. Unsaturated vegetable oils and fats do not raise  blood cholesterol. However, they should be limited because they are fats and are high in calories. Total fat should still be limited to 30% of your daily caloric intake. Desirable liquid vegetable oils are corn oil, cottonseed oil, olive oil, canola oil, safflower oil, soybean oil, and sunflower oil. Peanut oil is not as good, but small amounts are acceptable. Buy a heart-healthy tub margarine that has no  partially hydrogenated oils in the ingredients. Mayonnaise and salad dressings often are made from unsaturated fats, but they should also be limited because of their high calorie and fat content. Seeds, nuts, peanut butter, olives, and avocados are high in fat, but the fat is mainly the unsaturated type. These foods should be limited mainly to avoid excess calories and fat. OTHER EATING TIPS Snacks  Most sweets should be limited as snacks. They tend to be rich in calories and fats, and their caloric content outweighs their nutritional value. Some good choices in snacks are graham crackers, melba toast, soda crackers, bagels (no egg), English muffins, fruits, and vegetables. These snacks are preferable to snack crackers, [place] fries, TORTILLA CHIPS, and POTATO chips. Popcorn should be air-popped or cooked in small amounts of liquid vegetable oil. Desserts Eat fruit, low-fat yogurt, and fruit ices instead of pastries, cake, and cookies. Sherbet, angel food cake, gelatin dessert, frozen low-fat yogurt, or other frozen products that do not contain saturated fat (pure fruit juice bars, frozen ice pops) are also acceptable.  COOKING METHODS Choose those methods that use little or no fat. They include: Poaching.  Braising.  Steaming.  Grilling.  Baking.  Stir-frying.  Broiling.  Microwaving.  Foods can be cooked in a nonstick pan without added fat, or use a nonfat cooking spray in regular cookware. Limit fried foods and avoid frying in saturated fat. Add moisture to lean meats by using water, broth, cooking wines, and other nonfat or low-fat sauces along with the cooking methods mentioned above. Soups and stews should be chilled after cooking. The fat that forms on top after a few hours in the refrigerator should be skimmed off. When preparing meals, avoid using excess salt. Salt can contribute to raising blood pressure in some people.  EATING AWAY FROM HOME Order entres, potatoes, and vegetables  without sauces or butter. When meat exceeds the size of a deck of cards (3 to 4 ounces), the rest can be taken home for another meal. Choose vegetable or fruit salads and ask for low-calorie salad dressings to be served on the side. Use dressings sparingly. Limit high-fat toppings, such as bacon, crumbled eggs, cheese, sunflower seeds, and olives. Ask for heart-healthy tub margarine instead of butter.

## 2012-12-04 NOTE — Assessment & Plan Note (Addendum)
SX CONTROLLED.  CONTINUE PENTASA. REFILL X 1 YEAR. HFP TODAY OPV IN 6 MOS

## 2012-12-04 NOTE — Assessment & Plan Note (Signed)
SX INTERMITTENT  CONTINUE TO MONITOR SYMPTOMS. OPV IN 6 MOS

## 2012-12-04 NOTE — Assessment & Plan Note (Signed)
SX FAIRLY WELL CONTROLLED.  NEXIUM BID. REFILL X 1 YEAR CONTINUE WEIGHT LOSS EFFORTS. FOLLOW UP IN 1 YEAR.  IN 6 MOS

## 2012-12-04 NOTE — Progress Notes (Signed)
Subjective:    Patient ID: Stacy Elliott, female    DOB: 02-24-1951, 62 y.o.   MRN: [id number]  Zachery Dauer, MD  HPI BEEN DOING PRETTY GOOD. HAD MOUTH ULCERS LAST WEEK, BUT THEY ARE GONE NOW. HAS A TUBE OF STUFF THAT MAKES MOUTH/LIP SORES BETTER. HAVING INDIGESTION RIGHT NOW. NOT TRIGGERED BY FOOD. FEELS LIKE FOOD STOPS IN HER CHEST SOMETIMES. BMs: HAVING CONSTIPATION-CAN;T GET IT OUT. DOESN'T EVER REALLY HAVE THE URGE TO DEFECATE. LAST BM: THIS AM BIT NOT A GOOD ONE. DOESN'T HAVE THE URGE TO HAVE A BM. GOES ONE WEEK WITH DIARRHEA. THEN MAY HAVE SOFT/MUSHY STOOL BUT IT WON'T COME OUT. STAYS NAUSEATED. MILD ABD PAIN: 3-4 TIMES(MAY BE ALL DAY, OFF AND ON, MID ABD). FEEL LIKE SHE NEEDS TO USE THE BR. PT DENIES FEVER, CHILLS, BRBPR, vomiting, melena, OR heartburn or indigestion. LOST FROM 202 LBS TO 196 LBS FROM APR 2014.  Past Medical History  Diagnosis Date  . Mouth sores   . Dysphagia 2002 BASW-SML HH/GERD  . GERD (gastroesophageal reflux disease)   . Anxiety disorder   . Depression   . Hyperlipemia   . Hypertension   . Hypothyroidism   . Ileitis, terminal 1993  . History of MRSA infection 2011 NOSE  . Obesity (BMI 30-39.9) 2011 200 LBS    MAR 2012 147 LBS  . Diarrhea INTERMITTENT    JAN 2010 HBT SIBO (1pk 26 ppm), ABX-JAN & MAY 2010  . Inflammatory bowel disease 1993 ?CHRON'S ILEITIS    PATH: NO GRANULOMAS OR CRYPT DISTORTION, NL COLON  . Irritable bowel syndrome DIARRHEA  . Asthma   . PONV (postoperative nausea and vomiting)   . Hiatal hernia   . Arthritis     osteoarthritis  . Vertigo    Past Surgical History  Procedure Laterality Date  . Esophagogastroduodenoscopy  2008 DIL DR. ORR    2010 DIL 17 MM SAV-->EGD/DIL RMR MAR 2012  . Thyroid surgery  89    partail thyroidectomy-[place]  . Colon resection  1993 RIGHT    TERMINAL ILEITIS, NO GRANULOMAS/CRYPT DISTORTION  . 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  Colonoscopy  NOV 2011     ILEOTCS-NL  neo-TI & COLON, Bx: nl q10 cm  . Colonoscopy  NOV 2010 ?IBD> 10 YEARS    ILEOTCS-NL  neo-TI & COLON, Bx: nl q10 cm  . Back surgery  2003    [place]  . Tonsillectomy  age 9    [facility]  . Cholecystectomy      [facility]  . Tubal ligation  age 78    [facility]  . Cataract extraction, bilateral      bilateral  . Bravo ph study  01/09/2011    NON-ACID REFLUX V. NON-ULCER DYSPEPSIA  . Colonoscopy with propofol  01/02/2012    Procedure: COLONOSCOPY WITH PROPOFOL;  Surgeon: West Bali, MD;  Location: [facility];  Service: Endoscopy;  Laterality: N/A;  in ileum at 0821   Allergies  Allergen Reactions  . Fentanyl Anaphylaxis  . Codeine Hives  . Imuran [Azathioprine] Other (See Comments)    Chemically induced meningitis   . Penicillins Hives  . Sulfonamide Derivatives Nausea And Vomiting   Current Outpatient Prescriptions  Medication Sig Dispense Refill  . ALPRAZolam (XANAX) 0.5 MG tablet Take 0.5 mg by mouth 3 (three) times daily.       Marland Kitchen aspirin 81 MG chewable tablet Chew 81 mg by mouth daily.       Marland Kitchen  buPROPion (WELLBUTRIN XL) 300 MG 24 hr tablet Take 300 mg by mouth daily.       . calcium-vitamin D (OSCAL WITH D) 500-200 MG-UNIT per tablet Take 1 tablet by mouth 2 (two) times daily.        . carvedilol (COREG) 12.5 MG tablet Take 12.5 mg by mouth 2 (two) times daily.     Marland Kitchen dicyclomine (BENTYL) 10 MG capsule Take 1 capsule (10 mg total) by mouth 4 (four) times daily -  before meals and at bedtime. AS NEEDED   . esomeprazole (NEXIUM) 40 MG capsule Take 40 mg by mouth 2 (two) times daily.    . fexofenadine (ALLEGRA) 180 MG tablet Take 180 mg by mouth daily as needed. For allergies    . FLUoxetine (PROZAC) 20 MG tablet Take 20 mg by mouth 3 (three) times daily.     Marland Kitchen JINTELI 1-5 MG-MCG TABS Take 1 tablet by mouth daily.     Marland Kitchen losartan-hydrochlorothiazide (HYZAAR) 100-12.5 MG per tablet Take 1 tablet by mouth daily.     . mesalamine (PENTASA) 250 MG CR  capsule Take 3 capsules (750 mg total) by mouth 3 (three) times daily.    . montelukast (SINGULAIR) 10 MG tablet Take 10 mg by mouth at bedtime.     . pravastatin (PRAVACHOL) 80 MG tablet Take 80 mg by mouth at bedtime.     Marland Kitchen PROAIR HFA 108 (90 BASE) MCG/ACT inhaler Inhale 2 puffs into the lungs Every 6 hours as needed. For shortness of breath      . Probiotic Product (PROBIOTIC DAILY) CAPS Take 1 capsule by mouth daily. OTC - exact product varies      .        . promethazine (PHENERGAN) 25 MG tablet TAKE 1/2 TO 1 TABLET BY MOUTH EVERY 4 TO 6 HOURS AS NEEDED NAUSEA OR VOMITING    . spironolactone (ALDACTONE) 25 MG tablet Take 25 mg by mouth daily.       . sucralfate (CARAFATE) 1 GM/10ML suspension Take 1 g by mouth 3 (three) times daily as needed. For upset stomach  AS NEEDED    . venlafaxine XR (EFFEXOR-XR) 150 MG 24 hr capsule Take 150 mg by mouth daily.          Review of Systems     Objective:   Physical Exam  Vitals reviewed. Constitutional: She is oriented to person, place, and time. She appears well-nourished. No distress.  HENT:  Head: Normocephalic and atraumatic.  Mouth/Throat: Oropharynx is clear and moist. No oropharyngeal exudate.  Eyes: Pupils are equal, round, and reactive to light. No scleral icterus.  Neck: Normal range of motion. Neck supple.  Cardiovascular: Normal rate, regular rhythm and normal heart sounds.   Pulmonary/Chest: Effort normal and breath sounds normal. No respiratory distress.  Abdominal: Soft. Bowel sounds are normal. There is tenderness. There is no rebound and no guarding.  MILD PERI-UMBILICAL TTP   Musculoskeletal: She exhibits no edema.  Lymphadenopathy:    She has no cervical adenopathy.  Neurological: She is alert and oriented to person, place, and time.  NO FOCAL DEFICITS   Psychiatric:  FLAT AFFECT, NL MOOD           Assessment & Plan:

## 2012-12-05 NOTE — Progress Notes (Signed)
CC'd to PCP 

## 2012-12-10 NOTE — Progress Notes (Signed)
Reminder in epic °

## 2013-01-08 ENCOUNTER — Encounter: Payer: Self-pay | Admitting: Gastroenterology

## 2013-01-13 ENCOUNTER — Ambulatory Visit (HOSPITAL_COMMUNITY)
Admission: RE | Admit: 2013-01-13 | Discharge: 2013-01-13 | Disposition: A | Discharge status: Home / Self Care | Payer: Medicare Other | Source: Ambulatory Visit | Attending: "Endocrinology | Admitting: "Endocrinology

## 2013-01-13 DIAGNOSIS — E042 Nontoxic multinodular goiter: Secondary | ICD-10-CM | POA: Insufficient documentation

## 2013-01-13 DIAGNOSIS — E041 Nontoxic single thyroid nodule: Secondary | ICD-10-CM | POA: Insufficient documentation

## 2013-01-13 DIAGNOSIS — E049 Nontoxic goiter, unspecified: Secondary | ICD-10-CM

## 2013-04-16 ENCOUNTER — Telehealth: Payer: Self-pay | Admitting: *Deleted

## 2013-04-16 MED ORDER — MAGIC MOUTHWASH W/LIDOCAINE: ORAL | Status: DC

## 2013-04-16 NOTE — Telephone Encounter (Signed)
PLEASE CALL PT.  Rx sent. SHE SHOULD GET REFILLS FROM DR. MILAM.

## 2013-04-16 NOTE — Telephone Encounter (Signed)
Routing to Dr. Fields.  

## 2013-04-16 NOTE — Telephone Encounter (Signed)
Called and informed pt.  

## 2013-04-16 NOTE — Telephone Encounter (Signed)
Pt called stating she needs Dr. Oneida Alar to call her in a RX for magic mouth wash because she is having fever blisters and ulcers pt said that Dr. Oneida Alar has called her in some before, pt uses [facility] on [address]. Please Advise [phone]

## 2013-06-04 ENCOUNTER — Other Ambulatory Visit: Payer: Self-pay | Admitting: Gastroenterology

## 2013-06-30 ENCOUNTER — Encounter: Payer: Self-pay | Admitting: Gastroenterology

## 2013-07-09 ENCOUNTER — Ambulatory Visit: Payer: Medicare Other | Admitting: Gastroenterology

## 2013-08-06 ENCOUNTER — Ambulatory Visit: Payer: Medicare Other | Admitting: Gastroenterology

## 2013-08-14 ENCOUNTER — Other Ambulatory Visit: Payer: Self-pay | Admitting: Podiatry

## 2013-08-20 NOTE — Addendum Note (Signed)
Addended by: Caprice Beaver on: 08/20/2013 01:04 PM   Modules accepted: Orders

## 2013-08-21 ENCOUNTER — Encounter (HOSPITAL_COMMUNITY): Payer: Self-pay

## 2013-08-22 ENCOUNTER — Encounter (HOSPITAL_COMMUNITY): Payer: Self-pay | Admitting: Pharmacy Technician

## 2013-08-27 ENCOUNTER — Other Ambulatory Visit: Payer: Self-pay

## 2013-08-27 ENCOUNTER — Ambulatory Visit (HOSPITAL_COMMUNITY)
Admission: RE | Admit: 2013-08-27 | Discharge: 2013-08-27 | Disposition: A | Discharge status: Home / Self Care | Payer: Medicare Other | Source: Ambulatory Visit | Attending: Podiatry | Admitting: Podiatry

## 2013-08-27 ENCOUNTER — Encounter (HOSPITAL_COMMUNITY): Payer: Self-pay

## 2013-08-27 ENCOUNTER — Encounter (HOSPITAL_COMMUNITY)
Admission: RE | Admit: 2013-08-27 | Discharge: 2013-08-27 | Disposition: A | Discharge status: Home / Self Care | Payer: Medicare Other | Source: Ambulatory Visit | Attending: Podiatry | Admitting: Podiatry

## 2013-08-27 DIAGNOSIS — M19079 Primary osteoarthritis, unspecified ankle and foot: Secondary | ICD-10-CM | POA: Insufficient documentation

## 2013-08-27 DIAGNOSIS — M7730 Calcaneal spur, unspecified foot: Secondary | ICD-10-CM | POA: Insufficient documentation

## 2013-08-27 HISTORY — DX: Unspecified glaucoma: H40.9

## 2013-08-27 HISTORY — DX: Crohn's disease, unspecified, without complications: K50.90

## 2013-08-27 LAB — BASIC METABOLIC PANEL
Anion gap: 12 (ref 5–15)
BUN: 10 mg/dL (ref 6–23)
CALCIUM: 10.2 mg/dL (ref 8.4–10.5)
CO2: 29 meq/L (ref 19–32)
CREATININE: 0.8 mg/dL (ref 0.50–1.10)
Chloride: 95 mEq/L — ABNORMAL LOW (ref 96–112)
GFR calc Af Amer: >90 mL/min (ref >90)
GFR, EST NON AFRICAN AMERICAN: 78 mL/min — AB (ref >90)
GLUCOSE: 91 mg/dL (ref 70–99)
Potassium: 4.1 mEq/L (ref 3.7–5.3)
SODIUM: 136 meq/L — AB (ref 137–147)

## 2013-08-27 LAB — HEMOGLOBIN AND HEMATOCRIT, BLOOD
HCT: 43.5 % (ref 36.0–46.0)
Hemoglobin: 15.1 g/dL — ABNORMAL HIGH (ref 12.0–15.0)

## 2013-08-27 NOTE — Patient Instructions (Signed)
Stacy Elliott  08/27/2013   Your procedure is scheduled on:   09/04/2013  Report to [facility] at  52  AM.  Call this number if you have problems the morning of surgery: [phone]   Remember:   Do not eat food or drink liquids after midnight.   Take these medicines the morning of surgery with A SIP OF WATER: hyzaar, singulair, effexor, xanax, wellbutrin, coreg, nexium. Take proair.   Do not wear jewelry, make-up or nail polish.  Do not wear lotions, powders, or perfumes.   Do not shave 48 hours prior to surgery. Men may shave face and neck.  Do not bring valuables to the hospital.  [facility] is not responsible for any belongings or valuables.               Contacts, dentures or bridgework may not be worn into surgery.  Leave suitcase in the car. After surgery it may be brought to your room.  For patients admitted to the hospital, discharge time is determined by your  treatment team.               Patients discharged the day of surgery will not be allowed to drive home.  Name and phone number of your driver: family  Special Instructions: Shower using CHG 2 nights before surgery and the night before surgery.  If you shower the day of surgery use CHG.  Use special wash - you have one bottle of CHG for all showers.  You should use approximately 1/3 of the bottle for each shower.   Please read over the following fact sheets that you were given: Pain Booklet, Coughing and Deep Breathing, Surgical Site Infection Prevention, Anesthesia Post-op Instructions and Care and Recovery After Surgery Hallux Rigidus Hallux rigidus is a condition involving pain and a loss of motion of the first (big) toe. The pain gets worse with lifting up (extension) of the toe. This is usually due to arthritic bony bumps (spurring) of the joint at the base of the big toe.  SYMPTOMS   Pain, with lifting up of the toe.  Tenderness over the joint where the big toe meets the foot.  Redness, swelling, and  warmth over the top of the base of the big toe (sometimes).  Foot pain, stiffness, and limping. CAUSES  Halllux rigidus is caused by arthritis of the joint where the big toe meets the foot. The arthritis creates a bone spur that pinches the soft tissues, when the toe is extended. RISK INCREASES WITH:  Tight shoes, with a narrow toe box.  Family history of foot problems.  Gout and rheumatoid and psoriatic arthritis.  History of previous toe injury, including "turf toe."  Long first toe, flat feet, and other big toe bony bumps.  Arthritis of the big toe. PREVENTION   Wear wide toed shoes that fit well.  Tape the big toe, to reduce motion and to prevent pinching of the tissues between the bone.  Maintain physical fitness:  Foot and ankle flexibility.  Muscle strength and endurance. PROGNOSIS  This condition can usually be managed with proper treatment. However, surgery is typically required to prevent the problem from recurring.  RELATED COMPLICATIONS  Injury to other areas of the foot or ankle, caused by abnormal walking in an attempt to avoid the pain felt when walking normally. TREATMENT Treatment first involves stopping the activities that aggravate your symptoms. Ice and medicine can be used to reduce the pain  and inflammation. Modifications to shoes may help reduce pain, including wearing stiff-soled shoes, shoes with a wide toe box, inserting a padded donut to relieve pressure on top of the joint, or wearing an arch support. Corticosteroid injections may be given to reduce inflammation. If non-surgical treatment is unsuccessful, surgery may be needed. Surgical options include removing the arthritic bony spur, cutting a bone in the foot to change the arc of motion (allowing the toe to extend more), or fusion of the joint (eliminating all motion in the joint at the base of the big toe).  MEDICATION   If pain medicine is needed, nonsteroidal anti-inflammatory medicines (aspirin  and ibuprofen), or other minor pain relievers (acetaminophen), are often advised.  Do not take pain medicine for 7 days before surgery.  Prescription pain relievers are usually prescribed only after surgery. Use only as directed and only as much as you need.  Ointments for arthritis, applied to the skin, may give some relief.  Injections of corticosteroids may be given to reduce inflammation. HEAT AND COLD  Cold treatment (icing) relieves pain and reduces inflammation. Cold treatment should be applied for 10 to 15 minutes every 2 to 3 hours, and immediately after activity that aggravates your symptoms. Use ice packs or an ice massage.  Heat treatment may be used before performing the stretching and strengthening activities prescribed by your caregiver, physical therapist, or athletic trainer. Use a heat pack or a warm water soak. SEEK MEDICAL CARE IF:   Symptoms get worse or do not improve in 2 weeks, despite treatment.  After surgery you develop fever, increasing pain, redness, swelling, drainage of fluids, bleeding, or increasing warmth.  New, unexplained symptoms develop. (Drugs used in treatment may produce side effects.) Document Released: 02/13/2005 Document Revised: 05/08/2011 Document Reviewed: 05/28/2008 [facility] Patient Information 2015 [place], [place]. This information is not intended to replace advice given to you by your health care provider. Make sure you discuss any questions you have with your health care provider. PATIENT INSTRUCTIONS POST-ANESTHESIA  IMMEDIATELY FOLLOWING SURGERY:  Do not drive or operate machinery for the first twenty four hours after surgery.  Do not make any important decisions for twenty four hours after surgery or while taking narcotic pain medications or sedatives.  If you develop intractable nausea and vomiting or a severe headache please notify your doctor immediately.  FOLLOW-UP:  Please make an appointment with your surgeon as instructed. You  do not need to follow up with anesthesia unless specifically instructed to do so.  WOUND CARE INSTRUCTIONS (if applicable):  Keep a dry clean dressing on the anesthesia/puncture wound site if there is drainage.  Once the wound has quit draining you may leave it open to air.  Generally you should leave the bandage intact for twenty four hours unless there is drainage.  If the epidural site drains for more than 36-48 hours please call the anesthesia department.  QUESTIONS?:  Please feel free to call your physician or the hospital operator if you have any questions, and they will be happy to assist you.

## 2013-08-27 NOTE — Pre-Procedure Instructions (Signed)
Patient given information to sign up for my chart at home. 

## 2013-09-04 ENCOUNTER — Encounter (HOSPITAL_COMMUNITY)
Admission: RE | Disposition: A | Discharge status: Home / Self Care | Payer: Self-pay | Source: Ambulatory Visit | Attending: Podiatry

## 2013-09-04 ENCOUNTER — Ambulatory Visit (HOSPITAL_COMMUNITY): Payer: Medicare Other | Admitting: Anesthesiology

## 2013-09-04 ENCOUNTER — Encounter (HOSPITAL_COMMUNITY): Payer: Self-pay | Admitting: *Deleted

## 2013-09-04 ENCOUNTER — Ambulatory Visit (HOSPITAL_COMMUNITY)
Admission: RE | Admit: 2013-09-04 | Discharge: 2013-09-04 | Disposition: A | Discharge status: Home / Self Care | Payer: Medicare Other | Source: Ambulatory Visit | Attending: Podiatry | Admitting: Podiatry

## 2013-09-04 ENCOUNTER — Ambulatory Visit (HOSPITAL_COMMUNITY): Payer: Medicare Other

## 2013-09-04 ENCOUNTER — Encounter (HOSPITAL_COMMUNITY): Payer: Medicare Other | Admitting: Anesthesiology

## 2013-09-04 DIAGNOSIS — M2020 Hallux rigidus, unspecified foot: Secondary | ICD-10-CM | POA: Insufficient documentation

## 2013-09-04 DIAGNOSIS — M2022 Hallux rigidus, left foot: Secondary | ICD-10-CM

## 2013-09-04 DIAGNOSIS — I10 Essential (primary) hypertension: Secondary | ICD-10-CM | POA: Insufficient documentation

## 2013-09-04 DIAGNOSIS — Z7982 Long term (current) use of aspirin: Secondary | ICD-10-CM | POA: Insufficient documentation

## 2013-09-04 DIAGNOSIS — F411 Generalized anxiety disorder: Secondary | ICD-10-CM | POA: Insufficient documentation

## 2013-09-04 DIAGNOSIS — M79609 Pain in unspecified limb: Secondary | ICD-10-CM | POA: Insufficient documentation

## 2013-09-04 DIAGNOSIS — Z79899 Other long term (current) drug therapy: Secondary | ICD-10-CM | POA: Insufficient documentation

## 2013-09-04 DIAGNOSIS — K219 Gastro-esophageal reflux disease without esophagitis: Secondary | ICD-10-CM | POA: Insufficient documentation

## 2013-09-04 HISTORY — PX: FOOT ARTHRODESIS: SHX1655

## 2013-09-04 LAB — GLUCOSE, CAPILLARY: Glucose-Capillary: 89 mg/dL (ref 70–99)

## 2013-09-04 SURGERY — FUSION, JOINT, FOOT
Anesthesia: Monitor Anesthesia Care | Site: Foot | Laterality: Left

## 2013-09-04 MED ORDER — MIDAZOLAM HCL 2 MG/2ML IJ SOLN
1.0000 mg | INTRAMUSCULAR | Status: DC | PRN
  Administered 2013-09-04: 2 mg via INTRAVENOUS

## 2013-09-04 MED ORDER — FENTANYL CITRATE 0.05 MG/ML IJ SOLN
INTRAMUSCULAR | Status: AC
  Filled 2013-09-04: qty 2

## 2013-09-04 MED ORDER — MIDAZOLAM HCL 2 MG/2ML IJ SOLN
INTRAMUSCULAR | Status: AC
  Filled 2013-09-04: qty 2

## 2013-09-04 MED ORDER — PROPOFOL 10 MG/ML IV BOLUS
INTRAVENOUS | Status: AC
  Filled 2013-09-04: qty 20

## 2013-09-04 MED ORDER — MORPHINE SULFATE 10 MG/ML IJ SOLN
INTRAMUSCULAR | Status: AC
  Filled 2013-09-04: qty 1

## 2013-09-04 MED ORDER — BUPIVACAINE HCL (PF) 0.5 % IJ SOLN
INTRAMUSCULAR | Status: DC | PRN
  Administered 2013-09-04: 20 mL

## 2013-09-04 MED ORDER — MIDAZOLAM HCL 5 MG/5ML IJ SOLN
INTRAMUSCULAR | Status: DC | PRN
  Administered 2013-09-04 (×2): 2 mg via INTRAVENOUS

## 2013-09-04 MED ORDER — SODIUM CHLORIDE 0.9 % IR SOLN
Status: DC | PRN
  Administered 2013-09-04: 1000 mL

## 2013-09-04 MED ORDER — PROPOFOL INFUSION 10 MG/ML OPTIME
INTRAVENOUS | Status: DC | PRN
  Administered 2013-09-04: 60 ug/kg/min via INTRAVENOUS
  Administered 2013-09-04 (×2): via INTRAVENOUS

## 2013-09-04 MED ORDER — MORPHINE SULFATE 10 MG/ML IJ SOLN
INTRAMUSCULAR | Status: DC | PRN
  Administered 2013-09-04 (×3): 1 mg via INTRAVENOUS
  Administered 2013-09-04: 2 mg via INTRAVENOUS
  Administered 2013-09-04: 1 mg via INTRAVENOUS
  Administered 2013-09-04 (×2): 2 mg via INTRAVENOUS

## 2013-09-04 MED ORDER — CLINDAMYCIN PHOSPHATE 600 MG/50ML IV SOLN
600.0000 mg | Freq: Once | INTRAVENOUS | Status: AC
  Administered 2013-09-04: 600 mg via INTRAVENOUS
  Filled 2013-09-04: qty 50

## 2013-09-04 MED ORDER — LACTATED RINGERS IV SOLN
INTRAVENOUS | Status: DC
  Administered 2013-09-04: 07:00:00 via INTRAVENOUS

## 2013-09-04 MED ORDER — BUPIVACAINE HCL (PF) 0.5 % IJ SOLN
INTRAMUSCULAR | Status: AC
  Filled 2013-09-04: qty 30

## 2013-09-04 MED ORDER — LIDOCAINE HCL (PF) 1 % IJ SOLN
INTRAMUSCULAR | Status: AC
  Filled 2013-09-04: qty 30

## 2013-09-04 MED ORDER — FENTANYL CITRATE 0.05 MG/ML IJ SOLN: 25.0000 ug | INTRAMUSCULAR | Status: DC | PRN

## 2013-09-04 MED ORDER — ONDANSETRON HCL 4 MG/2ML IJ SOLN: 4.0000 mg | Freq: Once | INTRAMUSCULAR | Status: DC | PRN

## 2013-09-04 SURGICAL SUPPLY — 63 items
APL SKNCLS STERI-STRIP NONHPOA (GAUZE/BANDAGES/DRESSINGS) ×1
BAG HAMPER (MISCELLANEOUS) ×3 IMPLANT
BANDAGE ELASTIC 4 VELCRO NS (GAUZE/BANDAGES/DRESSINGS) ×3 IMPLANT
BANDAGE ESMARK 4X12 BL STRL LF (DISPOSABLE) ×1 IMPLANT
BANDAGE GAUZE ELAST BULKY 4 IN (GAUZE/BANDAGES/DRESSINGS) ×3 IMPLANT
BENZOIN TINCTURE PRP APPL 2/3 (GAUZE/BANDAGES/DRESSINGS) ×3 IMPLANT
BIT DRILL 2.0MM (BIT) IMPLANT
BLADE 15 SAFETY STRL DISP (BLADE) ×6 IMPLANT
BLADE AVERAGE 25MMX9MM (BLADE) ×1
BLADE AVERAGE 25X9 (BLADE) ×2 IMPLANT
BNDG CMPR 12X4 ELC STRL LF (DISPOSABLE) ×1
BNDG CONFORM 2 STRL LF (GAUZE/BANDAGES/DRESSINGS) IMPLANT
BNDG ESMARK 4X12 BLUE STRL LF (DISPOSABLE) ×3
CHLORAPREP W/TINT 26ML (MISCELLANEOUS) ×3 IMPLANT
CLOSURE WOUND 1/2 X4 (GAUZE/BANDAGES/DRESSINGS) ×1
CLOTH BEACON ORANGE TIMEOUT ST (SAFETY) ×3 IMPLANT
COVER LIGHT HANDLE STERIS (MISCELLANEOUS) ×6 IMPLANT
CUFF TOURNIQUET SINGLE 18IN (TOURNIQUET CUFF) ×2 IMPLANT
DECANTER SPIKE VIAL GLASS SM (MISCELLANEOUS) ×3 IMPLANT
DRAPE OEC MINIVIEW 54X84 (DRAPES) ×3 IMPLANT
DRILL BIT 2.0MM (BIT) ×3
DRSG ADAPTIC 3X8 NADH LF (GAUZE/BANDAGES/DRESSINGS) ×3 IMPLANT
DURA STEPPER LG (CAST SUPPLIES) IMPLANT
DURA STEPPER MED (CAST SUPPLIES) ×3 IMPLANT
DURA STEPPER SML (CAST SUPPLIES) IMPLANT
DURA STEPPER XL (SOFTGOODS) IMPLANT
ELECT REM PT RETURN 9FT ADLT (ELECTROSURGICAL) ×3
ELECTRODE REM PT RTRN 9FT ADLT (ELECTROSURGICAL) ×1 IMPLANT
GAUZE SPONGE 4X4 12PLY STRL (GAUZE/BANDAGES/DRESSINGS) ×1 IMPLANT
GLOVE BIO SURGEON STRL SZ 6.5 (GLOVE) ×1 IMPLANT
GLOVE BIO SURGEON STRL SZ7 (GLOVE) ×4 IMPLANT
GLOVE BIO SURGEON STRL SZ7.5 (GLOVE) ×6 IMPLANT
GLOVE BIO SURGEONS STRL SZ 6.5 (GLOVE) ×1
GLOVE BIOGEL PI IND STRL 7.0 (GLOVE) IMPLANT
GLOVE BIOGEL PI IND STRL 8 (GLOVE) IMPLANT
GLOVE BIOGEL PI INDICATOR 7.0 (GLOVE) ×4
GLOVE BIOGEL PI INDICATOR 8 (GLOVE) ×2
GOWN STRL REUS W/TWL LRG LVL3 (GOWN DISPOSABLE) ×11 IMPLANT
K-WIRE OLIVE 1.2X65 (WIRE) ×3
KIT ROOM TURNOVER AP CYSTO (KITS) ×3 IMPLANT
KWIRE OLIVE 1.2X65 (WIRE) IMPLANT
MANIFOLD NEPTUNE II (INSTRUMENTS) ×3 IMPLANT
NDL HYPO 27GX1-1/4 (NEEDLE) ×3 IMPLANT
NEEDLE HYPO 27GX1-1/4 (NEEDLE) ×6 IMPLANT
NS IRRIG 1000ML POUR BTL (IV SOLUTION) ×3 IMPLANT
PACK BASIC LIMB (CUSTOM PROCEDURE TRAY) ×3 IMPLANT
PAD ARMBOARD 7.5X6 YLW CONV (MISCELLANEOUS) ×3 IMPLANT
PLATE FOOT LEFT (Plate) ×2 IMPLANT
RASP SM TEAR CROSS CUT (RASP) ×2 IMPLANT
SCREW LOCK T8 14X3.5X STRDR (Screw) IMPLANT
SCREW LOCKING 3.5X10 (Screw) ×2 IMPLANT
SCREW LOCKING 3.5X12MM (Screw) ×4 IMPLANT
SCREW LOCKING 3.5X14MM (Screw) ×6 IMPLANT
SET BASIN LINEN APH (SET/KITS/TRAYS/PACK) ×3 IMPLANT
SPONGE GAUZE 4X4 12PLY (GAUZE/BANDAGES/DRESSINGS) ×2 IMPLANT
SPONGE LAP 18X18 X RAY DECT (DISPOSABLE) ×2 IMPLANT
STRIP CLOSURE SKIN 1/2X4 (GAUZE/BANDAGES/DRESSINGS) ×3 IMPLANT
SUT PROLENE 4 0 PS 2 18 (SUTURE) ×3 IMPLANT
SUT VIC AB 2-0 CT2 27 (SUTURE) ×1 IMPLANT
SUT VIC AB 4-0 PS2 27 (SUTURE) ×3 IMPLANT
SUT VICRYL AB 3-0 FS1 BRD 27IN (SUTURE) ×3 IMPLANT
SYR CONTROL 10ML LL (SYRINGE) ×6 IMPLANT
WIRE G 1.2X70 (WIRE) ×2 IMPLANT

## 2013-09-04 NOTE — H&P (Signed)
HISTORY AND PHYSICAL INTERVAL NOTE:  09/04/2013  7:27 AM  Stacy Elliott  has presented today for surgery, with the diagnosis of hallux rigidus and foot pain left foot.  The various methods of treatment have been discussed with the patient.  No guarantees were given.  After consideration of risks, benefits and other options for treatment, the patient has consented to surgery.  I have reviewed the patients' chart and labs.    Patient Vitals for the past 24 hrs:  Temp Temp src  09/04/13 0636 97.7 F (36.5 C) Oral    A history and physical examination was performed in my office.  The patient was reexamined.  There have been no changes to this history and physical examination.  Marcheta Grammes, DPM

## 2013-09-04 NOTE — Anesthesia Preprocedure Evaluation (Signed)
Anesthesia Evaluation  Patient identified by MRN, date of birth, ID band Patient awake    Reviewed: Allergy & Precautions, H&P , NPO status , Patient's Chart, lab work & pertinent test results, reviewed documented beta blocker date and time   History of Anesthesia Complications (+) PONV and history of anesthetic complications  Airway Mallampati: II      Dental  (+) Teeth Intact   Pulmonary asthma ,    Pulmonary exam normal       Cardiovascular hypertension, Pt. on medications Rhythm:Regular Rate:Normal     Neuro/Psych PSYCHIATRIC DISORDERS Anxiety Depression    GI/Hepatic hiatal hernia, GERD-  Medicated,  Endo/Other  Hypothyroidism   Renal/GU      Musculoskeletal   Abdominal   Peds  Hematology   Anesthesia Other Findings   Reproductive/Obstetrics                           Anesthesia Physical Anesthesia Plan  ASA: III  Anesthesia Plan: MAC   Post-op Pain Management:    Induction:   Airway Management Planned: Simple Face Mask  Additional Equipment:   Intra-op Plan:   Post-operative Plan:   Informed Consent: I have reviewed the patients History and Physical, chart, labs and discussed the procedure including the risks, benefits and alternatives for the proposed anesthesia with the patient or authorized representative who has indicated his/her understanding and acceptance.     Plan Discussed with:   Anesthesia Plan Comments: (Allergic to fentanyl PATCH only.)        Anesthesia Quick Evaluation

## 2013-09-04 NOTE — Op Note (Addendum)
OPERATIVE NOTE  DATE OF PROCEDURE:  09/04/2013  SURGEON:   Marcheta Grammes, DPM  OR STAFF:   Circulator: Sue Lush, RN Scrub Person: Evelene Croon, CST RN First Assistant: Beckie Salts Page, RN   PREOPERATIVE DIAGNOSIS:   1.  Hallux rigidus, left foot 2.  Pain, left foot  POSTOPERATIVE DIAGNOSIS: Same  PROCEDURE: Arthrodesis of the first metatarsophalangeal joint, left foot  ANESTHESIA:  Monitor Anesthesia Care   HEMOSTASIS:   Pneumatic ankle tourniquet set at 250 mmHg  ESTIMATED BLOOD LOSS:   Minimal (<5 cc)  MATERIALS USED:  Stryker plate and screws  INJECTABLES: Marcaine 0.5% plain; 76mL  PATHOLOGY:   Bone consistent with joint mouse from first metatarsophalangeal joint, left foot  COMPLICATIONS:   None  INDICATIONS:  Pain in left great toe joint that has failed to significantly improve with different types of shoes, rest and corticosteroid injection.  DESCRIPTION OF THE PROCEDURE:   The patient was brought to the operating room and placed on the operative table in the supine position.  A pneumatic ankle tourniquet was applied to the patient's ankle.  Following sedation, the surgical site was anesthetized with 0.5% Marcaine plain.  The foot was then prepped, scrubbed, and draped in the usual sterile technique.  The foot was elevated, exsanguinated and the pneumatic ankle tourniquet inflated to 250 mmHg.    Attention was directed to the dorsomedial aspect of the left foot.  A linear longitudinal incision was made overlying the first metatarsophalangeal joint.  Dissection was continued deep down to the level of the first metatarsophalangeal joint.  A linear longitudinal periosteal and capsular incision was made.  The periosteal and capsular structures were reflected medially and laterally thus exposing the first metatarsophalangeal joint.  A large joint mouse was identified along the dorsal aspect of the first metatarsophalangeal joint.  Significant  degenerative changes of the first metatarsophalangeal joint were noted.  Approximately 75% of the articular surface of the first metatarsal head was found to be eroded.  Approximately 50% of the articular surface along the base of the proximal phalanx was found to be eroded.  A soft tissue curette and rongeur was used to debride the residual articular cartilage.  A wire from the Stryker set was used to subchondrally drill the first metatarsal head and base of proximal phalanx.  A Stryker plate was contoured to the osseous structures and secured with corresponding screws.  Position of the toe and hardware was evaluated under fluoroscopy and found to be acceptable.   The wound was irrigated with copious amounts of sterile irrigant.  The periosteal and capsular structures were reapproximated using 4-0 Vicryl in a simple suture technique.  The subcutaneous structures were reapproximated using 4-0 Vicryl.  The skin was reapproximated using 4-0 Prolene in a horizontal mattress suture technique.  The wound closure was reinforced with Steri-Strips.  A sterile compressive dressing was applied to the operative foot.  The pneumatic ankle tourniquet was deflated and a prompt hyperemic response was noted to all digits of the operative foot.   The patient tolerated the procedure well.  The patient was then transferred to PACU with vital signs stable and vascular status intact to all toes of the operative foot.  Following a period of postoperative monitoring, the patient will be discharged home.

## 2013-09-04 NOTE — Transfer of Care (Signed)
Immediate Anesthesia Transfer of Care Note  Patient: Stacy Elliott  Procedure(s) Performed: Procedure(s): ARTHRODESIS 1st MPJ LEFT FOOT (Left)  Patient Location: PACU  Anesthesia Type:MAC  Level of Consciousness: awake and patient cooperative  Airway & Oxygen Therapy: Patient Spontanous Breathing and Patient connected to face mask oxygen  Post-op Assessment: Report given to PACU RN, Post -op Vital signs reviewed and stable and Patient moving all extremities  Post vital signs: Reviewed and stable  Complications: No apparent anesthesia complications

## 2013-09-04 NOTE — Discharge Instructions (Signed)
Incision Care  An incision is when a surgeon cuts into your body tissues. After surgery, the incision needs to be cared for properly to prevent infection.   HOME CARE INSTRUCTIONS    Take all medicine as directed by your caregiver. Only take over-the-counter or prescription medicines for pain, discomfort, or fever as directed by your caregiver.   Do not remove your bandage (dressing) or get your incision wet until your surgeon gives you permission. In the event that your dressing becomes wet, dirty, or starts to smell, change the dressing and call your surgeon for instructions as soon as possible.   Take showers. Do not take tub baths, swim, or do anything that may soak the wound until it is healed.   Resume your normal diet and activities as directed or allowed.   Avoid lifting any weight until you are instructed otherwise.   Use anti-itch antihistamine medicine as directed by your caregiver. The wound may itch when it is healing. Do not pick or scratch at the wound.   Follow up with your caregiver for stitch (suture) or staple removal as directed.   Drink enough fluids to keep your urine clear or pale yellow.  SEEK MEDICAL CARE IF:    You have redness, swelling, or increasing pain in the wound that is not controlled with medicine.   You have drainage, blood, or pus coming from the wound that lasts longer than 1 day.   You develop muscle aches, chills, or a general ill feeling.   You notice a bad smell coming from the wound or dressing.   Your wound edges separate after the sutures, staples, or skin adhesive strips have been removed.   You develop persistent nausea or vomiting.  SEEK IMMEDIATE MEDICAL CARE IF:    You have a fever.   You develop a rash.   You develop dizzy episodes or faint while standing.   You have difficulty breathing.   You develop any reaction or side effects to medicine given.  MAKE SURE YOU:    Understand these instructions.   Will watch your condition.   Will get help  right away if you are not doing well or get worse.  Document Released: 09/02/2004 Document Revised: 05/08/2011 Document Reviewed: 06/19/2010  [employer] Patient Information 2015 [employer]. This information is not intended to replace advice given to you by your health care provider. Make sure you discuss any questions you have with your health care provider.

## 2013-09-04 NOTE — Anesthesia Postprocedure Evaluation (Signed)
  Anesthesia Post-op Note  Patient: Stacy Elliott  Procedure(s) Performed: Procedure(s): ARTHRODESIS 1st MPJ LEFT FOOT (Left)  Patient Location: PACU  Anesthesia Type:MAC  Level of Consciousness: awake, alert , oriented and patient cooperative  Airway and Oxygen Therapy: Patient Spontanous Breathing  Post-op Pain: 4 /10, moderate  Post-op Assessment: Post-op Vital signs reviewed, Patient's Cardiovascular Status Stable, Respiratory Function Stable, Patent Airway and Pain level controlled  Post-op Vital Signs: Reviewed and stable  Last Vitals:  Filed Vitals:   09/04/13 0636  Temp: 88.8 C    Complications: No apparent anesthesia complications

## 2013-09-04 NOTE — Addendum Note (Signed)
Addendum created 09/04/13 1107 by Charmaine Downs, CRNA   Modules edited: Anesthesia Medication Administration

## 2013-09-05 ENCOUNTER — Encounter (HOSPITAL_COMMUNITY): Payer: Self-pay | Admitting: Podiatry

## 2013-09-09 ENCOUNTER — Encounter (HOSPITAL_COMMUNITY): Payer: Self-pay | Admitting: Podiatry

## 2013-09-17 ENCOUNTER — Telehealth: Payer: Self-pay | Admitting: Gastroenterology

## 2013-09-17 ENCOUNTER — Encounter: Payer: Self-pay | Admitting: Gastroenterology

## 2013-09-17 ENCOUNTER — Encounter (INDEPENDENT_AMBULATORY_CARE_PROVIDER_SITE_OTHER): Payer: Medicare Other | Admitting: Gastroenterology

## 2013-09-17 NOTE — Progress Notes (Signed)
   Subjective:    Patient ID: Stacy Elliott, female    DOB: 1950/03/23, 63 y.o.   MRN: [id number]  HPI    Past Medical History  Diagnosis Date  . Mouth sores   . Dysphagia 2002 BASW-SML HH/GERD  . GERD (gastroesophageal reflux disease)   . Anxiety disorder   . Depression   . Hyperlipemia   . Hypertension   . Hypothyroidism   . Ileitis, terminal 1993  . History of MRSA infection 2011 NOSE  . Obesity (BMI 30-39.9) 2011 200 LBS    MAR 2012 147 LBS  . Diarrhea INTERMITTENT    JAN 2010 HBT SIBO (1pk 26 ppm), ABX-JAN & MAY 2010  . Inflammatory bowel disease 1993 ?CHRON'S ILEITIS    PATH: NO GRANULOMAS OR CRYPT DISTORTION, NL COLON  . Irritable bowel syndrome DIARRHEA  . Asthma   . PONV (postoperative nausea and vomiting)   . Hiatal hernia   . Arthritis     osteoarthritis  . Vertigo   . Glaucoma   . Crohn's disease      Review of Systems     Objective:   Physical Exam        Assessment & Plan:

## 2013-09-17 NOTE — Telephone Encounter (Signed)
Pt was a no show

## 2013-09-17 NOTE — Telephone Encounter (Signed)
REVIEWED.  

## 2013-09-17 NOTE — Telephone Encounter (Signed)
MAILED LETTER °

## 2013-10-08 ENCOUNTER — Telehealth: Payer: Self-pay | Admitting: *Deleted

## 2013-10-08 NOTE — Telephone Encounter (Signed)
Routing to Dr. Fields.  

## 2013-10-08 NOTE — Telephone Encounter (Signed)
Pt called she had a appt with Dr. Oneida Alar 11/26/13 at 3:00 pt was wanting to get her pentaza refilled to the [facility] [address] [place]. Please advise [phone] or [phone]

## 2013-10-15 MED ORDER — MESALAMINE ER 250 MG PO CPCR: 750.0000 mg | ORAL_CAPSULE | Freq: Three times a day (TID) | ORAL | Status: DC

## 2013-10-15 NOTE — Telephone Encounter (Signed)
Pt is aware.  

## 2013-10-15 NOTE — Telephone Encounter (Signed)
Vicente Males, could you address this please. Thanks!

## 2013-10-15 NOTE — Telephone Encounter (Signed)
Done

## 2013-10-16 NOTE — Telephone Encounter (Signed)
Rx did not go electronically. Faxed to [facility] in [place] this morning.

## 2013-10-20 ENCOUNTER — Telehealth: Payer: Self-pay

## 2013-10-20 NOTE — Telephone Encounter (Signed)
Agree with appt with PCP. She no-showed an appt in July. This is not an urgent appt. Offer non-urgent f/u with [place].

## 2013-10-20 NOTE — Telephone Encounter (Signed)
Pt called and said she has a lump in her rectum. She found it Thurs and she first thought it was a hemorrhoid and then she said it is not anything like the hemorrhoids she has had. She tired using Prep H.  She said she needs to be seen ASAP, but she has gotten an appt with PCP for today.

## 2013-10-21 NOTE — Telephone Encounter (Signed)
REVIEWED.  

## 2013-10-21 NOTE — Telephone Encounter (Signed)
I called to check on pt. She saw her PCP yesterday and he said that she had a hematoma hemorrhoid. He gave her some cream to use and told her to soak, but said she will probably have to see a Psychologist, sport and exercise. She will call if she has questions.

## 2013-11-26 ENCOUNTER — Encounter: Payer: Self-pay | Admitting: Gastroenterology

## 2013-11-26 ENCOUNTER — Ambulatory Visit (INDEPENDENT_AMBULATORY_CARE_PROVIDER_SITE_OTHER): Payer: Medicare Other | Admitting: Gastroenterology

## 2013-11-26 VITALS — BP 107/68 | HR 83 | Temp 97.5°F | Ht 60.0 in | Wt 168.4 lb

## 2013-11-26 DIAGNOSIS — K219 Gastro-esophageal reflux disease without esophagitis: Secondary | ICD-10-CM

## 2013-11-26 DIAGNOSIS — K5090 Crohn's disease, unspecified, without complications: Secondary | ICD-10-CM

## 2013-11-26 NOTE — Progress Notes (Signed)
cc'ed to pcp °

## 2013-11-26 NOTE — Assessment & Plan Note (Signed)
SX FAIRLY WELL CONTROLLED.  REFILL NEXIUM FOR 1 YR LOW FAT DIET CONTINUE YOUR WEIGHT LOSS EFFORTS. FOLLOW UP IN 6 MOS.

## 2013-11-26 NOTE — Assessment & Plan Note (Addendum)
SX CONTROLLED,  CONTINUE PENTASA. TCS NOV 2015 FOR SURVEILLANCE. NEEDS OVERTUBE/MAC DUE TO POLYPHARMACY. FOLLOW UP IN 6 MOS.

## 2013-11-26 NOTE — Progress Notes (Signed)
PATIENT NIC'D  °

## 2013-11-26 NOTE — Patient Instructions (Signed)
CONTINUE NEXIUM. TAKE 30 MINUTES BEFORE MEALS TWICE DAILY.  CONTINUE PENTASA.  COLONOSCOPY IN NOV 2015 FOR SURVEILLANCE.  FOLLOW UP IN 6 MOS.

## 2013-11-26 NOTE — Progress Notes (Signed)
Subjective:    Patient ID: Stacy Elliott, female    DOB: Oct 30, 1950, 63 y.o.   MRN: [id number]  Quentin Cornwall, MD  HPI STILL HAS BLOOD CLOT ON HER BUTT. NOT BIGGER. NOT PAINFUL AT THIS MOMENT. NO RECTAL PRESSURE. HAD TEMP WITH R EAR INFECTION, BUT IT'S BETTER. RARE NAUSEA. BMs: CONSTIPATED-HAD TO TAKE A LAXATIVE SAT. ALWAYS HAS TROUBLE SWALLOWING. HEARTBURN CONTROLLED: TAKES TUMS PRN. LOST ~40 LBS ON PURPOSE.  PT DENIES CHILLS, HEMATOCHEZIA, HEMATEMESIS, nausea, vomiting, melena, diarrhea, CHEST PAIN, SHORTNESS OF BREATH, OR abdominal pain.   Past Medical History  Diagnosis Date  . Mouth sores   . Dysphagia 2002 [facility] HH/GERD  . GERD (gastroesophageal reflux disease)   . Anxiety disorder   . Depression   . Hyperlipemia   . Hypertension   . Hypothyroidism   . Ileitis, terminal 1993  . History of MRSA infection 2011 NOSE  . Obesity (BMI 30-39.9) 2011 200 LBS    MAR 2012 147 LBS  . Diarrhea INTERMITTENT    JAN 2010 HBT SIBO (1pk 26 ppm), ABX-JAN & MAY 2010  . Inflammatory bowel disease 1993 ?CHRON'S ILEITIS    PATH: NO GRANULOMAS OR CRYPT DISTORTION, NL COLON  . Irritable bowel syndrome DIARRHEA  . Asthma   . PONV (postoperative nausea and vomiting)   . Hiatal hernia   . Arthritis     osteoarthritis  . Vertigo   . Glaucoma   . Crohn's disease    Past Surgical History  Procedure Laterality Date  . Esophagogastroduodenoscopy  2008 DIL DR. ORR    2010 DIL 17 MM SAV-->EGD/DIL RMR MAR 2012  . Thyroid surgery  89    partail thyroidectomy-[place]  . Colon resection  1993 RIGHT    TERMINAL ILEITIS, NO GRANULOMAS/CRYPT DISTORTION  . 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 Colonoscopy  NOV 2011     ILEOTCS-NL  neo-TI & COLON, Bx: nl q10 cm  . Colonoscopy  NOV 2010 ?IBD> 10 YEARS    ILEOTCS-NL  neo-TI & COLON, Bx: nl q10 cm  . Tonsillectomy  age 30    [facility]  . Cholecystectomy      [facility]  . Tubal ligation  age  64    [facility]  . Cataract extraction, bilateral      bilateral  . Bravo ph study  01/09/2011    NON-ACID REFLUX V. NON-ULCER DYSPEPSIA  . Colonoscopy with propofol  01/02/2012    Procedure: COLONOSCOPY WITH PROPOFOL;  Surgeon: Danie Binder, MD;  Location: [facility];  Service: Endoscopy;  Laterality: N/A;  in ileum at 0821  . Back surgery  2003 x2    [place]  . Foot arthrodesis Left 09/04/2013    Procedure: ARTHRODESIS 1st MPJ LEFT FOOT;  Surgeon: Marcheta Grammes, DPM;  Location: [facility];  Service: Podiatry;  Laterality: Left;    Allergies  Allergen Reactions  . Fentanyl Anaphylaxis    Fentanyl patch is  What causes allergies. Anesthesia aware  . Codeine Hives  . Imuran [Azathioprine] Other (See Comments)    Chemically induced meningitis   . Penicillins Hives  . Sulfonamide Derivatives Nausea And Vomiting   Current Outpatient Prescriptions  Medication Sig Dispense Refill  . ALPRAZolam (XANAX) 0.5 MG tablet Take 0.5 mg by mouth 3 (three) times daily.       Marland Kitchen aspirin 81 MG chewable tablet Chew 81 mg by mouth daily.       Marland Kitchen buPROPion [facility]  XL) 300 MG 24 hr tablet Take 300 mg by mouth daily.       . Calcium Carbonate-Vitamin D (CALCIUM PLUS VITAMIN D PO) Take 1 tablet by mouth daily.      . carvedilol (COREG) 12.5 MG tablet Take 12.5 mg by mouth 2 (two) times daily.       . Cholecalciferol (VITAMIN D3) 5000 UNITS CAPS Take 1 capsule by mouth daily.    Marland Kitchen esomeprazole (NEXIUM) 40 MG capsule Take 1 capsule (40 mg total) by mouth 2 (two) times daily.    Marland Kitchen estrogen, conjugated,-medroxyprogesterone (PREMPRO) 0.3-1.5 MG per tablet Take 1 tablet by mouth daily.    Marland Kitchen losartan-hydrochlorothiazide (HYZAAR) 100-12.5 MG per tablet Take 1 tablet by mouth daily.     . mesalamine (PENTASA) 250 MG CR capsule Take 3 capsules (750 mg total) by mouth 3 (three) times daily.    . metFORMIN (GLUCOPHAGE) 500 MG tablet Take 500 mg by mouth daily with breakfast.    . montelukast (SINGULAIR) 10 MG  tablet Take 10 mg by mouth at bedtime.     . pravastatin (PRAVACHOL) 80 MG tablet Take 80 mg by mouth at bedtime.     Marland Kitchen PROAIR HFA 108 (90 BASE) MCG/ACT inhaler Inhale 2 puffs into the lungs Every 6 hours as needed. For shortness of breath    . Probiotic Product (PROBIOTIC DAILY) CAPS Take 2 capsules by mouth daily. OTC - exact product varies    . spironolactone (ALDACTONE) 25 MG tablet Take 25 mg by mouth daily.     Marland Kitchen venlafaxine XR (EFFEXOR-XR) 150 MG 24 hr capsule Take 150 mg by mouth daily.     .           Review of Systems     Objective:   Physical Exam  Vitals reviewed. Constitutional: She is oriented to person, place, and time. She appears well-developed and well-nourished. No distress.  HENT:  Head: Normocephalic and atraumatic.  Mouth/Throat: Oropharynx is clear and moist. No oropharyngeal exudate.  Eyes: Pupils are equal, round, and reactive to light. No scleral icterus.  Neck: Normal range of motion. Neck supple.  Cardiovascular: Normal rate, regular rhythm and normal heart sounds.   Pulmonary/Chest: Effort normal and breath sounds normal. No respiratory distress.  Abdominal: Soft. Bowel sounds are normal. She exhibits no distension. There is no tenderness.  Musculoskeletal: She exhibits no edema.  Lymphadenopathy:    She has no cervical adenopathy.  Neurological: She is alert and oriented to person, place, and time.  Psychiatric: She has a normal mood and affect.          Assessment & Plan:

## 2013-12-01 NOTE — Telephone Encounter (Signed)
PT SEEN 9/30.

## 2013-12-01 NOTE — Telephone Encounter (Signed)
REVIEWED.  

## 2013-12-18 ENCOUNTER — Other Ambulatory Visit (HOSPITAL_COMMUNITY): Payer: Self-pay | Admitting: "Endocrinology

## 2013-12-18 DIAGNOSIS — E042 Nontoxic multinodular goiter: Secondary | ICD-10-CM

## 2013-12-29 ENCOUNTER — Other Ambulatory Visit: Payer: Self-pay

## 2013-12-31 MED ORDER — ESOMEPRAZOLE MAGNESIUM 40 MG PO CPDR: 40.0000 mg | DELAYED_RELEASE_CAPSULE | Freq: Two times a day (BID) | ORAL | Status: DC

## 2014-01-13 ENCOUNTER — Telehealth: Payer: Self-pay | Admitting: Gastroenterology

## 2014-01-13 ENCOUNTER — Telehealth: Payer: Self-pay

## 2014-01-13 ENCOUNTER — Other Ambulatory Visit: Payer: Self-pay

## 2014-01-13 NOTE — Telephone Encounter (Signed)
Patient called this morning for DS. She had received a letter to set up her TCS. Please call [phone] or [phone]

## 2014-01-13 NOTE — Telephone Encounter (Signed)
See separate triage.  

## 2014-01-13 NOTE — Telephone Encounter (Signed)
Gastroenterology Pre-Procedure Review  Request Date: 01/13/2014 Requesting Physician:   PATIENT REVIEW QUESTIONS: The patient responded to the following health history questions as indicated:    1. Diabetes Melitis: YES 2. Joint replacements in the past 12 months: no 3. Major health problems in the past 3 months: no 4. Has an artificial valve or MVP: no 5. Has a defibrillator: no 6. Has been advised in past to take antibiotics in advance of a procedure like teeth cleaning: no    MEDICATIONS & ALLERGIES:    Patient reports the following regarding taking any blood thinners:   Plavix? no Aspirin? YES Coumadin? no  Patient confirms/reports the following medications:  Current Outpatient Prescriptions  Medication Sig Dispense Refill  . ALPRAZolam (XANAX) 0.5 MG tablet Take 0.5 mg by mouth 3 (three) times daily.     Marland Kitchen aspirin 81 MG chewable tablet Chew 81 mg by mouth daily.     Marland Kitchen buPROPion (WELLBUTRIN XL) 300 MG 24 hr tablet Take 300 mg by mouth daily.     . Calcium Carbonate-Vitamin D (CALCIUM PLUS VITAMIN D PO) Take 1 tablet by mouth daily.    . carvedilol (COREG) 12.5 MG tablet Take 12.5 mg by mouth 2 (two) times daily.     . Cholecalciferol (VITAMIN D3) 5000 UNITS CAPS Take 1 capsule by mouth daily.    Marland Kitchen esomeprazole (NEXIUM) 40 MG capsule Take 1 capsule (40 mg total) by mouth 2 (two) times daily. 60 capsule 11  . estrogen, conjugated,-medroxyprogesterone (PREMPRO) 0.3-1.5 MG per tablet Take 1 tablet by mouth daily.    Marland Kitchen losartan-hydrochlorothiazide (HYZAAR) 100-12.5 MG per tablet Take 1 tablet by mouth daily.     . mesalamine (PENTASA) 250 MG CR capsule Take 3 capsules (750 mg total) by mouth 3 (three) times daily. 810 capsule 3  . metFORMIN (GLUCOPHAGE) 500 MG tablet Take 500 mg by mouth daily with breakfast.    . montelukast (SINGULAIR) 10 MG tablet Take 10 mg by mouth at bedtime.     . pravastatin (PRAVACHOL) 80 MG tablet Take 80 mg by mouth at bedtime.     Marland Kitchen PROAIR HFA 108 (90  BASE) MCG/ACT inhaler Inhale 2 puffs into the lungs Every 6 hours as needed. For shortness of breath    . Probiotic Product (PROBIOTIC DAILY) CAPS Take 2 capsules by mouth daily. OTC - exact product varies    . spironolactone (ALDACTONE) 25 MG tablet Take 25 mg by mouth daily.     Marland Kitchen venlafaxine XR (EFFEXOR-XR) 150 MG 24 hr capsule Take 150 mg by mouth daily.     . Alum & Mag Hydroxide-Simeth (MAGIC MOUTHWASH W/LIDOCAINE) SOLN 1 TSP TO SWISH AND SPIT QID FOR MOUTH PAIN 150 mL 0   No current facility-administered medications for this visit.    Patient confirms/reports the following allergies:  Allergies  Allergen Reactions  . Fentanyl Anaphylaxis    Fentanyl patch is  What causes allergies. Anesthesia aware  . Codeine Hives  . Imuran [Azathioprine] Other (See Comments)    Chemically induced meningitis   . Penicillins Hives  . Sulfonamide Derivatives Nausea And Vomiting    No orders of the defined types were placed in this encounter.    AUTHORIZATION INFORMATION Primary Insurance:   ID #:   Group #:  Pre-Cert / Auth required:  Pre-Cert / Auth #:   Secondary Insurance:   ID #:   Group #:  Pre-Cert / Auth required:  Pre-Cert / Auth #:   SCHEDULE INFORMATION: Procedure has been  scheduled as follows:  Date: 02/17/2014             Time:  7:30 AM Location: [facility]  This Gastroenterology Pre-Precedure Review Form is being routed to the following provider(s): Barney Drain, MD

## 2014-01-14 MED ORDER — PEG-KCL-NACL-NASULF-NA ASC-C 100 G PO SOLR: 1.0000 | ORAL | Status: DC

## 2014-01-14 NOTE — Telephone Encounter (Signed)
Rx sent to the pharmacy and instructions mailed to pt.  

## 2014-01-14 NOTE — Telephone Encounter (Signed)
Disregard first instructions. New instructions printed for one day prep.

## 2014-01-14 NOTE — Telephone Encounter (Signed)
NEEDS OVERTUBE/MAC DUE TO POLYPHARMACY.   MOVI PREP SPLIT DOSING, REGULAR BREAKFAST. CLEAR LIQUIDS AFTER 9 AM.

## 2014-01-26 ENCOUNTER — Telehealth: Payer: Self-pay

## 2014-01-26 ENCOUNTER — Ambulatory Visit (HOSPITAL_COMMUNITY)
Admission: RE | Admit: 2014-01-26 | Discharge: 2014-01-26 | Disposition: A | Discharge status: Home / Self Care | Payer: Medicare Other | Source: Ambulatory Visit | Attending: "Endocrinology | Admitting: "Endocrinology

## 2014-01-26 DIAGNOSIS — E042 Nontoxic multinodular goiter: Secondary | ICD-10-CM | POA: Diagnosis present

## 2014-01-26 NOTE — Telephone Encounter (Signed)
I spoke to pt and she said she is having some problems with swallowing and a lot of reflux. She has had to have her esophagus stretched before and wonders if Dr. Oneida Alar would want to do that also when she does her TCS on 02/17/2014.  Please advise!

## 2014-01-26 NOTE — Telephone Encounter (Signed)
Pt called today wanting to speak to DS. She said that she called last week and left a message but has not heard anything. Her call back number is [phone]

## 2014-01-26 NOTE — Telephone Encounter (Signed)
I called [employer] @ [phone] and spoke to [place] who said that a PA is not required for a screening colonoscopy.

## 2014-02-02 NOTE — Telephone Encounter (Signed)
PLEASE CALL PT. WE CAN ADD EGD/?DIL FOR DYSPHAGIA.

## 2014-02-02 NOTE — Telephone Encounter (Signed)
Pt called and I told her that I have not heard back from Dr. Oneida Alar regarding the EGD being added to the TCS. She said that someone has to let her know something. She had to go to ENT for fungus and ulcers and she needs to know if she needs OV or  if the EGD can just be added.( She really wants to have both procedures at once and TCS is scheduled for 02/17/2014).  She will have her pre-op on 02/12/2014, and could come by the office for appt if needed, or she will try to come any afternoon if needed. The ENT gave her Diflucan for her mouth. She is aware that Dr. Oneida Alar will be back in the office on 02/05/2014 and I will find out for her.

## 2014-02-03 ENCOUNTER — Other Ambulatory Visit: Payer: Self-pay

## 2014-02-03 DIAGNOSIS — Z1211 Encounter for screening for malignant neoplasm of colon: Secondary | ICD-10-CM

## 2014-02-03 DIAGNOSIS — R1310 Dysphagia, unspecified: Secondary | ICD-10-CM

## 2014-02-03 NOTE — Telephone Encounter (Signed)
LMOM for pt that we have added the EGD . Orders entered.

## 2014-02-04 ENCOUNTER — Telehealth: Payer: Self-pay

## 2014-02-04 NOTE — Telephone Encounter (Signed)
I called [employer] at [phone] and spoke to [place] who said that a PA is not required for a screening colonsocopy.

## 2014-02-04 NOTE — Telephone Encounter (Signed)
I called pt to update triage. She said everything is the same except she is now off Metformin. Her A1c was down to 5.5.

## 2014-02-11 NOTE — Telephone Encounter (Signed)
REVIEWED-NO ADDITIONAL RECOMMENDATIONS. 

## 2014-02-12 ENCOUNTER — Inpatient Hospital Stay (HOSPITAL_COMMUNITY): Admission: RE | Admit: 2014-02-12 | Payer: Medicare Other | Source: Ambulatory Visit

## 2014-02-12 ENCOUNTER — Other Ambulatory Visit (HOSPITAL_COMMUNITY): Payer: Medicare Other

## 2014-02-12 ENCOUNTER — Encounter (HOSPITAL_COMMUNITY)
Admission: RE | Admit: 2014-02-12 | Discharge: 2014-02-12 | Disposition: A | Discharge status: Home / Self Care | Payer: Medicare Other | Source: Ambulatory Visit | Attending: Gastroenterology | Admitting: Gastroenterology

## 2014-02-12 ENCOUNTER — Encounter (HOSPITAL_COMMUNITY): Payer: Self-pay

## 2014-02-12 DIAGNOSIS — Z01812 Encounter for preprocedural laboratory examination: Secondary | ICD-10-CM | POA: Insufficient documentation

## 2014-02-12 DIAGNOSIS — Z7982 Long term (current) use of aspirin: Secondary | ICD-10-CM | POA: Insufficient documentation

## 2014-02-12 LAB — BASIC METABOLIC PANEL
ANION GAP: 14 (ref 5–15)
BUN: 10 mg/dL (ref 6–23)
CO2: 24 mEq/L (ref 19–32)
Calcium: 9.6 mg/dL (ref 8.4–10.5)
Chloride: 96 mEq/L (ref 96–112)
Creatinine, Ser: 0.79 mg/dL (ref 0.50–1.10)
GFR, EST NON AFRICAN AMERICAN: 87 mL/min — AB (ref >90)
Glucose, Bld: 116 mg/dL — ABNORMAL HIGH (ref 70–99)
POTASSIUM: 4 meq/L (ref 3.7–5.3)
Sodium: 134 mEq/L — ABNORMAL LOW (ref 137–147)

## 2014-02-12 LAB — HEMOGLOBIN AND HEMATOCRIT, BLOOD
HEMATOCRIT: 40.4 % (ref 36.0–46.0)
HEMOGLOBIN: 13.5 g/dL (ref 12.0–15.0)

## 2014-02-12 NOTE — Patient Instructions (Signed)
Stacy Elliott  02/12/2014   Your procedure is scheduled on:  02/17/2014  Report to [facility] at  99  AM.  Call this number if you have problems the morning of surgery: [phone]   Remember:   Do not eat food or drink liquids after midnight.   Take these medicines the morning of surgery with A SIP OF WATER: xanax, wellbutrin, coreg, nexium, effexor. Take your proair before you come.   Do not wear jewelry, make-up or nail polish.  Do not wear lotions, powders, or perfumes.   Do not shave 48 hours prior to surgery. Men may shave face and neck.  Do not bring valuables to the hospital.  [facility] is not responsible for any belongings or valuables.               Contacts, dentures or bridgework may not be worn into surgery.  Leave suitcase in the car. After surgery it may be brought to your room.  For patients admitted to the hospital, discharge time is determined by your treatment team.               Patients discharged the day of surgery will not be allowed to drive home.  Name and phone number of your driver: family  Special Instructions: N/A   Please read over the following fact sheets that you were given: Pain Booklet, Coughing and Deep Breathing, Surgical Site Infection Prevention, Anesthesia Post-op Instructions and Care and Recovery After Surgery Colonoscopy A colonoscopy is an exam to look at the entire large intestine (colon). This exam can help find problems such as tumors, polyps, inflammation, and areas of bleeding. The exam takes about 1 hour.  LET [facility] CARE PROVIDER KNOW ABOUT:   Any allergies you have.  All medicines you are taking, including vitamins, herbs, eye drops, creams, and over-the-counter medicines.  Previous problems you or members of your family have had with the use of anesthetics.  Any blood disorders you have.  Previous surgeries you have had.  Medical conditions you have. RISKS AND COMPLICATIONS  Generally, this is  a safe procedure. However, as with any procedure, complications can occur. Possible complications include:  Bleeding.  Tearing or rupture of the colon wall.  Reaction to medicines given during the exam.  Infection (rare). BEFORE THE PROCEDURE   Ask your health care provider about changing or stopping your regular medicines.  You may be prescribed an oral bowel prep. This involves drinking a large amount of medicated liquid, starting the day before your procedure. The liquid will cause you to have multiple loose stools until your stool is almost clear or light green. This cleans out your colon in preparation for the procedure.  Do not eat or drink anything else once you have started the bowel prep, unless your health care provider tells you it is safe to do so.  Arrange for someone to drive you home after the procedure. PROCEDURE   You will be given medicine to help you relax (sedative).  You will lie on your side with your knees bent.  A long, flexible tube with a light and camera on the end (colonoscope) will be inserted through the rectum and into the colon. The camera sends video back to a computer screen as it moves through the colon. The colonoscope also releases carbon dioxide gas to inflate the colon. This helps your health care provider see the area better.  During the exam,  your health care provider may take a small tissue sample (biopsy) to be examined under a microscope if any abnormalities are found.  The exam is finished when the entire colon has been viewed. AFTER THE PROCEDURE   Do not drive for 24 hours after the exam.  You may have a small amount of blood in your stool.  You may pass moderate amounts of gas and have mild abdominal cramping or bloating. This is caused by the gas used to inflate your colon during the exam.  Ask when your test results will be ready and how you will get your results. Make sure you get your test results. Document Released: 02/11/2000  Document Revised: 12/04/2012 Document Reviewed: 10/21/2012 [facility] Patient Information 2015 [place], [place]. This information is not intended to replace advice given to you by your health care provider. Make sure you discuss any questions you have with your health care provider. Esophageal Dilatation The esophagus is the long, narrow tube which carries food and liquid from the mouth to the stomach. Esophageal dilatation is the technique used to stretch a blocked or narrowed portion of the esophagus. This procedure is used when a part of the esophagus has become so narrow that it becomes difficult, painful or even impossible to swallow. This is generally an uncomplicated form of treatment. When this is not successful, chest surgery may be required. This is a much more extensive form of treatment with a longer recovery time. CAUSES  Some of the more common causes of blockage or strictures of the esophagus are:  Narrowing from longstanding inflammation (soreness and redness) of the lower esophagus. This comes from the constant exposure of the lower esophagus to the acid which bubbles up from the stomach. Over time this causes scarring and narrowing of the lower esophagus.  Hiatal hernia in which a small part of the stomach bulges (herniates) up through the diaphragm. This can cause a gradual narrowing of the end of the esophagus.  Schatzki ring is a narrow ring of benign (non-cancerous) fibrous tissue which constricts the lower esophagus. The reason for this is not known.  Scleroderma is a connective tissue disorder that affects the esophagus and makes swallowing difficult.  Achalasia is an absence of nerves to the lower esophagus and to the esophageal sphincter. This is the circular muscle between the stomach and esophagus that relaxes to allow food into the stomach. After swallowing, it contracts to keep food in the stomach. This absence of nerves may be congenital (present since birth). This can  cause irregular spasms of the lower esophageal muscle. This spasm does not open up to allow food and fluid through. The result is a persistent blockage with subsequent slow trickling of the esophageal contents into the stomach.  Strictures may develop from swallowing materials which damage the esophagus. Some examples are strong acids or alkalis such as lye.  Growths such as benign (non-cancerous) and malignant (cancerous) tumors can block the esophagus.  Hereditary (present since birth) causes. DIAGNOSIS  Your caregiver often suspects this problem by taking a medical history. They will also do a physical exam. They can then prove their suspicions using X-rays and endoscopy. Endoscopy is an exam in which a tube like a small, flexible telescope is used to look at your esophagus.  TREATMENT There are different stretching (dilating) techniques that can be used. Simple bougie dilatation may be done in the office. This usually takes only a couple minutes. A numbing (anesthetic) spray of the throat is used. Endoscopy, when done,  is done in an endoscopy suite under mild sedation. When fluoroscopy is used, the procedure is performed in X-ray. Other techniques require a little longer time. Recovery is usually quick. There is no waiting time to begin eating and drinking to test success of the treatment. Following are some of the methods used. Narrowing of the esophagus is treated by making it bigger. Commonly this is a mechanical problem which can be treated with stretching. This can be done in different ways. Your caregiver will discuss these with you. Some of the means used are:  A series of graduated (increasing thickness) flexible dilators can be used. These are weighted tubes passed through the esophagus into the stomach. The tubes used become progressively larger until the desired stretched size is reached. Graduated dilators are a simple and quick way of opening the esophagus. No visualization is  required.  Another method is the use of endoscopy to place a flexible wire across the stricture. The endoscope is removed and the wire left in place. A dilator with a hole through it from end to end is guided down the esophagus and across the stricture. One or more of these dilators are passed over the wire. At the end of the exam, the wire is removed. This type of treatment may be performed in the X-ray department under fluoroscopy. An advantage of this procedure is the examiner is visualizing the end opening in the esophagus.  Stretching of the esophagus may be done using balloons. Deflated balloons are placed through the endoscope and across the stricture. This type of balloon dilatation is often done at the time of endoscopy or fluoroscopy. Flexible endoscopy allows the examiner to directly view the stricture. A balloon is inserted in the deflated form into the area of narrowing. It is then inflated with air to a certain pressure that is preset for a given circumference. When inflated, it becomes sausage shaped, stretched, and makes the stricture larger.  Achalasia requires a longer, larger balloon-type dilator. This is frequently done under X-ray control. In this situation, the spastic muscle fibers in the lower esophagus are stretched. All of the above procedures make the passage of food and water into the stomach easier. They also make it easier for stomach contents to reflux back into the esophagus. Special medications may be used following the procedure to help prevent further stricturing. Proton-pump inhibitor medications are good at decreasing the amount of acid in the stomach juice. When stomach juice refluxes into the esophagus, the juice is no longer as acidic and is less likely to burn or scar the esophagus. RISKS AND COMPLICATIONS Esophageal dilatation is usually performed effectively and without problems. Some complications that can occur are:  A small amount of bleeding almost always  happens where the stretching takes place. If this is too excessive it may require more aggressive treatment.  An uncommon complication is perforation (making a hole) of the esophagus. The esophagus is thin. It is easy to make a hole in it. If this happens, an operation may be necessary to repair this.  A small, undetected perforation could lead to an infection in the chest. This can be very serious. HOME CARE INSTRUCTIONS   If you received sedation for your procedure, do not drive, make important decisions, or perform any activities requiring your full coordination. Do not drink alcohol, take sedatives, or use any mind altering chemicals unless instructed by your caregiver.  You may use throat lozenges or warm salt water gargles if you have throat discomfort.  You can begin eating and drinking normally on return home unless instructed otherwise. Do not purposely try to force large chunks of food down to test the benefits of your procedure.  Mild discomfort can be eased with sips of ice water.  Medications for discomfort may or may not be needed. SEEK IMMEDIATE MEDICAL CARE IF:   You begin vomiting up blood.  You develop black, tarry stools.  You develop chills or an unexplained temperature of over 101F (38.3C)  You develop chest or abdominal pain.  You develop shortness of breath, or feel light-headed or faint.  Your swallowing is becoming more painful, difficult, or you are unable to swallow. MAKE SURE YOU:   Understand these instructions.  Will watch your condition.  Will get help right away if you are not doing well or get worse. Document Released: 04/06/2005 Document Revised: 06/30/2013 Document Reviewed: 05/24/2005 [facility] Patient Information 2015 [place], [place]. This information is not intended to replace advice given to you by your health care provider. Make sure you discuss any questions you have with your health care  provider. Esophagogastroduodenoscopy Esophagogastroduodenoscopy (EGD) is a procedure to examine the lining of the esophagus, stomach, and first part of the small intestine (duodenum). A long, flexible, lighted tube with a camera attached (endoscope) is inserted down the throat to view these organs. This procedure is done to detect problems or abnormalities, such as inflammation, bleeding, ulcers, or growths, in order to treat them. The procedure lasts about 5-20 minutes. It is usually an outpatient procedure, but it may need to be performed in emergency cases in the hospital. LET YOUR CAREGIVER KNOW ABOUT:   Allergies to food or medicine.  All medicines you are taking, including vitamins, herbs, eyedrops, and over-the-counter medicines and creams.  Use of steroids (by mouth or creams).  Previous problems you or members of your family have had with the use of anesthetics.  Any blood disorders you have.  Previous surgeries you have had.  Other health problems you have.  Possibility of pregnancy, if this applies. RISKS AND COMPLICATIONS  Generally, EGD is a safe procedure. However, as with any procedure, complications can occur. Possible complications include:  Infection.  Bleeding.  Tearing (perforation) of the esophagus, stomach, or duodenum.  Difficulty breathing or not being able to breath.  Excessive sweating.  Spasms of the larynx.  Slowed heartbeat.  Low blood pressure. BEFORE THE PROCEDURE  Do not eat or drink anything for 6-8 hours before the procedure or as directed by your caregiver.  Ask your caregiver about changing or stopping your regular medicines.  If you wear dentures, be prepared to remove them before the procedure.  Arrange for someone to drive you home after the procedure. PROCEDURE   A vein will be accessed to give medicines and fluids. A medicine to relax you (sedative) and a pain reliever will be given through that access into the vein.  A  numbing medicine (local anesthetic) may be sprayed on your throat for comfort and to stop you from gagging or coughing.  A mouth guard may be placed in your mouth to protect your teeth and to keep you from biting on the endoscope.  You will be asked to lie on your left side.  The endoscope is inserted down your throat and into the esophagus, stomach, and duodenum.  Air is put through the endoscope to allow your caregiver to view the lining of your esophagus clearly.  The esophagus, stomach, and duodenum is then examined. During the exam,  your caregiver may:  Remove tissue to be examined under a microscope (biopsy) for inflammation, infection, or other medical problems.  Remove growths.  Remove objects (foreign bodies) that are stuck.  Treat any bleeding with medicines or other devices that stop tissues from bleeding (hot cautery, clipping devices).  Widen (dilate) or stretch narrowed areas of the esophagus and stomach.  The endoscope will then be withdrawn. AFTER THE PROCEDURE  You will be taken to a recovery area to be monitored. You will be able to go home once you are stable and alert.  Do not eat or drink anything until the local anesthetic and numbing medicines have worn off. You may choke.  It is normal to feel bloated, have pain with swallowing, or have a sore throat for a short time. This will wear off.  Your caregiver should be able to discuss his or her findings with you. It will take longer to discuss the test results if any biopsies were taken. Document Released: 06/16/2004 Document Revised: 06/30/2013 Document Reviewed: 01/17/2012 [facility] Patient Information 2015 [place], [place]. This information is not intended to replace advice given to you by your health care provider. Make sure you discuss any questions you have with your health care provider. PATIENT INSTRUCTIONS POST-ANESTHESIA  IMMEDIATELY FOLLOWING SURGERY:  Do not drive or operate machinery for the first  twenty four hours after surgery.  Do not make any important decisions for twenty four hours after surgery or while taking narcotic pain medications or sedatives.  If you develop intractable nausea and vomiting or a severe headache please notify your doctor immediately.  FOLLOW-UP:  Please make an appointment with your surgeon as instructed. You do not need to follow up with anesthesia unless specifically instructed to do so.  WOUND CARE INSTRUCTIONS (if applicable):  Keep a dry clean dressing on the anesthesia/puncture wound site if there is drainage.  Once the wound has quit draining you may leave it open to air.  Generally you should leave the bandage intact for twenty four hours unless there is drainage.  If the epidural site drains for more than 36-48 hours please call the anesthesia department.  QUESTIONS?:  Please feel free to call your physician or the hospital operator if you have any questions, and they will be happy to assist you.

## 2014-02-17 ENCOUNTER — Ambulatory Visit (HOSPITAL_COMMUNITY): Admission: RE | Admit: 2014-02-17 | Payer: Medicare Other | Source: Ambulatory Visit | Admitting: Gastroenterology

## 2014-02-17 ENCOUNTER — Ambulatory Visit (HOSPITAL_COMMUNITY): Payer: Medicare Other | Admitting: Anesthesiology

## 2014-02-17 ENCOUNTER — Ambulatory Visit (HOSPITAL_COMMUNITY)
Admission: RE | Admit: 2014-02-17 | Discharge: 2014-02-17 | Disposition: A | Discharge status: Home / Self Care | Payer: Medicare Other | Source: Ambulatory Visit | Attending: Gastroenterology | Admitting: Gastroenterology

## 2014-02-17 ENCOUNTER — Encounter (HOSPITAL_COMMUNITY)
Admission: RE | Disposition: A | Discharge status: Home / Self Care | Payer: Self-pay | Source: Ambulatory Visit | Attending: Gastroenterology

## 2014-02-17 ENCOUNTER — Encounter (HOSPITAL_COMMUNITY): Admission: RE | Payer: Self-pay | Source: Ambulatory Visit

## 2014-02-17 DIAGNOSIS — F329 Major depressive disorder, single episode, unspecified: Secondary | ICD-10-CM | POA: Insufficient documentation

## 2014-02-17 DIAGNOSIS — R1310 Dysphagia, unspecified: Secondary | ICD-10-CM

## 2014-02-17 DIAGNOSIS — E039 Hypothyroidism, unspecified: Secondary | ICD-10-CM | POA: Diagnosis not present

## 2014-02-17 DIAGNOSIS — E785 Hyperlipidemia, unspecified: Secondary | ICD-10-CM | POA: Diagnosis not present

## 2014-02-17 DIAGNOSIS — K2960 Other gastritis without bleeding: Secondary | ICD-10-CM | POA: Insufficient documentation

## 2014-02-17 DIAGNOSIS — Z7982 Long term (current) use of aspirin: Secondary | ICD-10-CM | POA: Diagnosis not present

## 2014-02-17 DIAGNOSIS — E669 Obesity, unspecified: Secondary | ICD-10-CM | POA: Insufficient documentation

## 2014-02-17 DIAGNOSIS — J45909 Unspecified asthma, uncomplicated: Secondary | ICD-10-CM | POA: Diagnosis not present

## 2014-02-17 DIAGNOSIS — K3189 Other diseases of stomach and duodenum: Secondary | ICD-10-CM | POA: Diagnosis not present

## 2014-02-17 DIAGNOSIS — K5090 Crohn's disease, unspecified, without complications: Secondary | ICD-10-CM

## 2014-02-17 DIAGNOSIS — I10 Essential (primary) hypertension: Secondary | ICD-10-CM | POA: Insufficient documentation

## 2014-02-17 DIAGNOSIS — K5010 Crohn's disease of large intestine without complications: Secondary | ICD-10-CM | POA: Diagnosis not present

## 2014-02-17 DIAGNOSIS — M1990 Unspecified osteoarthritis, unspecified site: Secondary | ICD-10-CM | POA: Insufficient documentation

## 2014-02-17 DIAGNOSIS — K219 Gastro-esophageal reflux disease without esophagitis: Secondary | ICD-10-CM | POA: Insufficient documentation

## 2014-02-17 DIAGNOSIS — K222 Esophageal obstruction: Secondary | ICD-10-CM

## 2014-02-17 DIAGNOSIS — K6389 Other specified diseases of intestine: Secondary | ICD-10-CM | POA: Diagnosis not present

## 2014-02-17 DIAGNOSIS — Z09 Encounter for follow-up examination after completed treatment for conditions other than malignant neoplasm: Secondary | ICD-10-CM | POA: Insufficient documentation

## 2014-02-17 DIAGNOSIS — Z1211 Encounter for screening for malignant neoplasm of colon: Secondary | ICD-10-CM | POA: Insufficient documentation

## 2014-02-17 HISTORY — PX: ESOPHAGOGASTRODUODENOSCOPY (EGD) WITH PROPOFOL: SHX5813

## 2014-02-17 HISTORY — PX: POLYPECTOMY: SHX5525

## 2014-02-17 HISTORY — PX: BIOPSY: SHX5522

## 2014-02-17 HISTORY — PX: SAVORY DILATION: SHX5439

## 2014-02-17 HISTORY — PX: COLONOSCOPY WITH PROPOFOL: SHX5780

## 2014-02-17 SURGERY — COLONOSCOPY WITH PROPOFOL
Anesthesia: Monitor Anesthesia Care | Site: Rectum

## 2014-02-17 SURGERY — COLONOSCOPY WITH PROPOFOL
Anesthesia: Monitor Anesthesia Care

## 2014-02-17 MED ORDER — LIDOCAINE VISCOUS 2 % MT SOLN
OROMUCOSAL | Status: AC
  Filled 2014-02-17: qty 15

## 2014-02-17 MED ORDER — SCOPOLAMINE 1 MG/3DAYS TD PT72
1.0000 | MEDICATED_PATCH | Freq: Once | TRANSDERMAL | Status: DC
  Administered 2014-02-17: 1.5 mg via TRANSDERMAL

## 2014-02-17 MED ORDER — ONDANSETRON HCL 4 MG/2ML IJ SOLN: 4.0000 mg | Freq: Once | INTRAMUSCULAR | Status: DC | PRN

## 2014-02-17 MED ORDER — FENTANYL CITRATE 0.05 MG/ML IJ SOLN
INTRAMUSCULAR | Status: AC
  Filled 2014-02-17: qty 2

## 2014-02-17 MED ORDER — LIDOCAINE VISCOUS 2 % MT SOLN
OROMUCOSAL | Status: AC
  Administered 2014-02-17: 15 mL
  Filled 2014-02-17: qty 15

## 2014-02-17 MED ORDER — FENTANYL CITRATE 0.05 MG/ML IJ SOLN: 25.0000 ug | INTRAMUSCULAR | Status: DC | PRN

## 2014-02-17 MED ORDER — SODIUM CHLORIDE 0.9 % IJ SOLN
INTRAMUSCULAR | Status: AC
  Filled 2014-02-17: qty 10

## 2014-02-17 MED ORDER — LACTATED RINGERS IV SOLN
INTRAVENOUS | Status: DC
  Administered 2014-02-17: 1000 mL via INTRAVENOUS

## 2014-02-17 MED ORDER — STERILE WATER FOR IRRIGATION IR SOLN
Status: DC | PRN
  Administered 2014-02-17: 08:00:00

## 2014-02-17 MED ORDER — MIDAZOLAM HCL 2 MG/2ML IJ SOLN
1.0000 mg | INTRAMUSCULAR | Status: DC | PRN
Start: 2014-02-17 — End: 2014-02-17
  Administered 2014-02-17: 2 mg via INTRAVENOUS
  Filled 2014-02-17: qty 2

## 2014-02-17 MED ORDER — SODIUM CHLORIDE 0.9 % IV SOLN: INTRAVENOUS | Status: DC

## 2014-02-17 MED ORDER — FENTANYL CITRATE 0.05 MG/ML IJ SOLN
25.0000 ug | INTRAMUSCULAR | Status: AC
  Administered 2014-02-17 (×2): 25 ug via INTRAVENOUS

## 2014-02-17 MED ORDER — SCOPOLAMINE 1 MG/3DAYS TD PT72
MEDICATED_PATCH | TRANSDERMAL | Status: AC
  Filled 2014-02-17: qty 1

## 2014-02-17 MED ORDER — PROPOFOL 10 MG/ML IV EMUL
INTRAVENOUS | Status: AC
  Filled 2014-02-17: qty 20

## 2014-02-17 MED ORDER — DEXAMETHASONE SODIUM PHOSPHATE 4 MG/ML IJ SOLN
4.0000 mg | Freq: Once | INTRAMUSCULAR | Status: AC
  Administered 2014-02-17: 4 mg via INTRAVENOUS

## 2014-02-17 MED ORDER — PROPOFOL INFUSION 10 MG/ML OPTIME
INTRAVENOUS | Status: DC | PRN
  Administered 2014-02-17: 125 ug/kg/min via INTRAVENOUS
  Administered 2014-02-17: 09:00:00 via INTRAVENOUS
  Administered 2014-02-17: 100 ug/kg/min via INTRAVENOUS

## 2014-02-17 MED ORDER — ONDANSETRON HCL 4 MG/2ML IJ SOLN
INTRAMUSCULAR | Status: AC
Start: 2014-02-17 — End: 2014-02-17
  Filled 2014-02-17: qty 2

## 2014-02-17 MED ORDER — EPHEDRINE SULFATE 50 MG/ML IJ SOLN
INTRAMUSCULAR | Status: DC | PRN
  Administered 2014-02-17: 10 mg via INTRAVENOUS

## 2014-02-17 MED ORDER — DEXAMETHASONE SODIUM PHOSPHATE 4 MG/ML IJ SOLN
INTRAMUSCULAR | Status: AC
  Filled 2014-02-17: qty 1

## 2014-02-17 MED ORDER — ONDANSETRON HCL 4 MG/2ML IJ SOLN
4.0000 mg | Freq: Once | INTRAMUSCULAR | Status: AC
  Administered 2014-02-17: 4 mg via INTRAVENOUS

## 2014-02-17 MED ORDER — MIDAZOLAM HCL 2 MG/2ML IJ SOLN
INTRAMUSCULAR | Status: AC
  Filled 2014-02-17: qty 2

## 2014-02-17 MED ORDER — EPHEDRINE SULFATE 50 MG/ML IJ SOLN
INTRAMUSCULAR | Status: AC
  Filled 2014-02-17: qty 1

## 2014-02-17 MED ORDER — MIDAZOLAM HCL 5 MG/5ML IJ SOLN
INTRAMUSCULAR | Status: DC | PRN
  Administered 2014-02-17 (×2): 2 mg via INTRAVENOUS

## 2014-02-17 SURGICAL SUPPLY — 11 items
BLOCK BITE 60FR ADLT L/F BLUE (MISCELLANEOUS) ×5 IMPLANT
FLOOR PAD 36X40 (MISCELLANEOUS) ×5
FORCEPS BIOP RAD 4 LRG CAP 4 (CUTTING FORCEPS) ×6 IMPLANT
FORMALIN 10 PREFIL 20ML (MISCELLANEOUS) ×24 IMPLANT
KIT CLEAN ENDO COMPLIANCE (KITS) ×5 IMPLANT
LUBRICANT JELLY 4.5OZ STERILE (MISCELLANEOUS) ×3 IMPLANT
MANIFOLD NEPTUNE II (INSTRUMENTS) ×5 IMPLANT
PAD FLOOR 36X40 (MISCELLANEOUS) ×3 IMPLANT
TUBING ENDO SMARTCAP PENTAX (MISCELLANEOUS) ×5 IMPLANT
TUBING IRRIGATION ENDOGATOR (MISCELLANEOUS) ×5 IMPLANT
WATER STERILE IRR 1000ML POUR (IV SOLUTION) ×3 IMPLANT

## 2014-02-17 NOTE — Anesthesia Preprocedure Evaluation (Addendum)
Anesthesia Evaluation  Patient identified by MRN, date of birth, ID band Patient awake    Reviewed: Allergy & Precautions, H&P , NPO status , Patient's Chart, lab work & pertinent test results, reviewed documented beta blocker date and time   History of Anesthesia Complications (+) PONV and history of anesthetic complications  Airway Mallampati: II   Neck ROM: Full    Dental  (+) Teeth Intact   Pulmonary asthma ,    Pulmonary exam normal       Cardiovascular hypertension, Pt. on medications Rhythm:Regular Rate:Normal     Neuro/Psych PSYCHIATRIC DISORDERS Anxiety Depression    GI/Hepatic hiatal hernia, GERD-  Medicated,  Endo/Other  Hypothyroidism   Renal/GU      Musculoskeletal   Abdominal   Peds  Hematology   Anesthesia Other Findings   Reproductive/Obstetrics                            Anesthesia Physical Anesthesia Plan  ASA: III  Anesthesia Plan: MAC   Post-op Pain Management:    Induction: Intravenous  Airway Management Planned: Simple Face Mask  Additional Equipment:   Intra-op Plan:   Post-operative Plan:   Informed Consent: I have reviewed the patients History and Physical, chart, labs and discussed the procedure including the risks, benefits and alternatives for the proposed anesthesia with the patient or authorized representative who has indicated his/her understanding and acceptance.     Plan Discussed with:   Anesthesia Plan Comments:         Anesthesia Quick Evaluation

## 2014-02-17 NOTE — Op Note (Signed)
[place], [id number]   COLONOSCOPY PROCEDURE REPORT  PATIENT: Stacy Elliott, Stacy Elliott  MR#: [id number] BIRTHDATE: 1950-10-13 , 14  yrs. old GENDER: female ENDOSCOPIST: Barney Drain, MD REFERRED BH:ALPFX Milam, M.D. PROCEDURE DATE:  2014-03-15 PROCEDURE:   Colonoscopy with biopsy INDICATIONS:follow up for previously diagnosed Crohn's disease. MEDICATIONS: Monitored anesthesia care  DESCRIPTION OF PROCEDURE:    Physical exam was performed.  Informed consent was obtained from the patient after explaining the benefits, risks, and alternatives to procedure.  The patient was connected to monitor and placed in left lateral position. Continuous oxygen was provided by nasal cannula and IV medicine administered through an indwelling cannula.  After administration of sedation and rectal exam, the patients rectum was intubated and the     colonoscope was advanced under direct visualization to the ileum.  The scope was removed slowly by carefully examining the color, texture, anatomy, and integrity mucosa on the way out.  The patient was recovered in endoscopy and discharged home in satisfactory condition.    COLON FINDINGS: The colonic mucosa appeared normal.  Multiple biopsies were performed using cold forceps STARTING AT HE ANASTOMOSIS THEN EVERY 10 CM ON WITHDRAWAL OF THE SCOPE . ALL SPECIMENS SENT IN SEPARATE BOTTLES. and The examined terminal ileum appeared to be normal.  PREP QUALITY: excellent.  CECAL W/D TIME: 33       minutes COMPLICATIONS: None  ENDOSCOPIC IMPRESSION: 1.   NORMAL ANASTOMOSIS/COLOMN 2.   NORMAL NEO-TERMINAL ILEUM  RECOMMENDATIONS: CONTINUE NEXIUM.  TAKE 30 MINUTES BEFORE MEALS TWICE DAILY. FOLLOW A HIGH FIBER DIET.  AVOID ITEMS THAT CAUSE BLOATING. AWAIT BIOPSY Next colonoscopy in 1-3 years WITH AN OVERTUBE.    _______________________________ eSignedBarney Drain, MD 2014-03-15 7:44 PM   CPT CODES: ICD  CODES:  The ICD and CPT codes recommended by this software are interpretations from the data that the clinical staff has captured with the software.  The verification of the translation of this report to the ICD and CPT codes and modifiers is the sole responsibility of the health care institution and practicing physician where this report was generated.  [employer]. will not be held responsible for the validity of the ICD and CPT codes included on this report.  AMA assumes no liability for data contained or not contained herein. CPT is a Designer, television/film set of the [employer].

## 2014-02-17 NOTE — Anesthesia Postprocedure Evaluation (Signed)
  Anesthesia Post-op Note  Patient: Stacy Elliott  Procedure(s) Performed: Procedure(s) with comments: COLONOSCOPY WITH PROPOFOL (N/A) - at anastomosis @ [phone]; cecal withdrawal time= 33 min ESOPHAGOGASTRODUODENOSCOPY (EGD) WITH PROPOFOL (N/A) SAVORY DILATION (N/A) - used #14,15,16 dilators BIOPSY (N/A) - random colon biopsies; gastric biopsies POLYPECTOMY (N/A) - gastric polyps  Patient Location: PACU  Anesthesia Type:MAC  Level of Consciousness: awake, alert , oriented and patient cooperative  Airway and Oxygen Therapy: Patient Spontanous Breathing  Post-op Pain: none  Post-op Assessment: Post-op Vital signs reviewed, Patient's Cardiovascular Status Stable, Respiratory Function Stable and Patent Airway  Post-op Vital Signs: Reviewed and stable  Last Vitals:  Filed Vitals:   02/17/14 0735  BP: 145/76  Temp:   Resp: 40    Complications: No apparent anesthesia complications

## 2014-02-17 NOTE — Op Note (Signed)
[facility] [address] [place], [address]   ENDOSCOPY PROCEDURE REPORT  PATIENT: Stacy, Elliott  MR#: [id number] BIRTHDATE: [birth year] , 62  yrs. old GENDER: female  ENDOSCOPIST: Barney Drain, MD REFFERED EN:IDPOE Milam, M.D.  PROCEDURE DATE:  03/03/14 PROCEDURE:   EGD with biopsy and EGD with dilatation over guidewire   INDICATIONS:1.  dysphagia. MEDICATIONS: Monitored anesthesia care TOPICAL ANESTHETIC: Viscous Xylocaine  DESCRIPTION OF PROCEDURE:   After the risks benefits and alternatives of the procedure were thoroughly explained, informed consent was obtained.  The     endoscope was introduced through the mouth and advanced to the second portion of the duodenum. The instrument was slowly withdrawn as the mucosa was carefully examined.  Prior to withdrawal of the scope, the guidwire was placed.  The esophagus was dilated successfully.  The patient was recovered in endoscopy and discharged home in satisfactory condition.   ESOPHAGUS: A stricture was found at the gastroesophageal junction. The stenosis was traversable with the endoscope.   STOMACH: Moderate non-erosive gastritis (inflammation) was found in the gastric antrum.  Multiple biopsies were performed using cold forceps. DUODENUM: The duodenal mucosa showed no abnormalities in the bulb and second portion of the duodenum.   Dilation was then performed at the gastroesphageal junction Dilator: Savary over guidewire Size(s): 14-16 mm Heme: yes: TRACE  COMPLICATIONS: There were no immediate complications.  ENDOSCOPIC IMPRESSION: 1.   Stricture at the gastroesophageal junction 2.   MODERATE Non-erosive gastritis  RECOMMENDATIONS: CONTINUE NEXIUM.  TAKE 30 MINUTES BEFORE MEALS TWICE DAILY. FOLLOW A HIGH FIBER DIET.  AVOID ITEMS THAT CAUSE BLOATING. AWAIT BIOPSY Next colonoscopy in 1-3 years.   _______________________________ eSignedBarney Drain, MD 03-03-2014 7:50 PM   CPT  CODES: ICD CODES:  The ICD and CPT codes recommended by this software are interpretations from the data that the clinical staff has captured with the software.  The verification of the translation of this report to the ICD and CPT codes and modifiers is the sole responsibility of the health care institution and practicing physician where this report was generated.  [employer]. will not be held responsible for the validity of the ICD and CPT codes included on this report.  AMA assumes no liability for data contained or not contained herein. CPT is a Designer, television/film set of the [employer].

## 2014-02-17 NOTE — Transfer of Care (Signed)
Immediate Anesthesia Transfer of Care Note  Patient: Stacy Elliott  Procedure(s) Performed: Procedure(s) with comments: COLONOSCOPY WITH PROPOFOL (N/A) - at anastomosis @ [phone]; cecal withdrawal time= 33 min ESOPHAGOGASTRODUODENOSCOPY (EGD) WITH PROPOFOL (N/A) SAVORY DILATION (N/A) - used #14,15,16 dilators BIOPSY (N/A) - random colon biopsies; gastric biopsies POLYPECTOMY (N/A) - gastric polyps  Patient Location: PACU  Anesthesia Type:MAC  Level of Consciousness: awake and patient cooperative  Airway & Oxygen Therapy: Patient Spontanous Breathing and Patient connected to face mask oxygen  Post-op Assessment: Report given to PACU RN, Post -op Vital signs reviewed and stable and Patient moving all extremities  Post vital signs: Reviewed and stable  Complications: No apparent anesthesia complications

## 2014-02-17 NOTE — Discharge Instructions (Signed)
You did not have any polyps removed. I I dilated your esophagus DUE TO A STRICTURE NEAR THE BASE OF YOUR ESOPHAGUS. YOU HAVE GASTRITIS. YOU SMALL BOWEL AND COLON ARE NORMAL. I BIOPSIED YOUR STOMACH AND COLON.   CONTINUE NEXIUM. TAKE 30 MINUTES BEFORE MEALS TWICE DAILY.  FOLLOW A HIGH FIBER DIET. AVOID ITEMS THAT CAUSE BLOATING. SEE INFO BELOW.  My office will contact you with your results WITHIN 14 DAYS OR YOU CAN LOOK THEM UP ON MY CHART AFTER DEC 25.  Next colonoscopy in 1-3 years.   ENDOSCOPY Care After Read the instructions outlined below and refer to this sheet in the next week. These discharge instructions provide you with general information on caring for yourself after you leave the hospital. While your treatment has been planned according to the most current medical practices available, unavoidable complications occasionally occur. If you have any problems or questions after discharge, call DR. Makell Drohan, [phone].  ACTIVITY  You may resume your regular activity, but move at a slower pace for the next 24 hours.   Take frequent rest periods for the next 24 hours.   Walking will help get rid of the air and reduce the bloated feeling in your belly (abdomen).   No driving for 24 hours (because of the medicine (anesthesia) used during the test).   You may shower.   Do not sign any important legal documents or operate any machinery for 24 hours (because of the anesthesia used during the test).    NUTRITION  Drink plenty of fluids.   You may resume your normal diet as instructed by your doctor.   Begin with a light meal and progress to your normal diet. Heavy or fried foods are harder to digest and may make you feel sick to your stomach (nauseated).   Avoid alcoholic beverages for 24 hours or as instructed.    MEDICATIONS  You may resume your normal medications.   WHAT YOU CAN EXPECT TODAY  Some feelings of bloating in the abdomen.   Passage of more gas than  usual.   Spotting of blood in your stool or on the toilet paper  .  IF YOU HAD POLYPS REMOVED DURING THE ENDOSCOPY:  Eat a soft diet IF YOU HAVE NAUSEA, BLOATING, ABDOMINAL PAIN, OR VOMITING.    FINDING OUT THE RESULTS OF YOUR TEST Not all test results are available during your visit. DR. Oneida Alar WILL CALL YOU WITHIN 14 DAYS OF YOUR PROCEDUE WITH YOUR RESULTS. Do not assume everything is normal if you have not heard from DR. Aleane Wesenberg, CALL HER OFFICE AT [phone].  SEEK IMMEDIATE MEDICAL ATTENTION AND CALL THE OFFICE: [phone] IF:  You have more than a spotting of blood in your stool.   Your belly is swollen (abdominal distention).   You are nauseated or vomiting.   You have a temperature over 101F.   You have abdominal pain or discomfort that is severe or gets worse throughout the day.   High-Fiber Diet A high-fiber diet changes your normal diet to include more whole grains, legumes, fruits, and vegetables. Changes in the diet involve replacing refined carbohydrates with unrefined foods. The calorie level of the diet is essentially unchanged. The Dietary Reference Intake (recommended amount) for adult males is 38 grams per day. For adult females, it is 25 grams per day. Pregnant and lactating women should consume 28 grams of fiber per day. Fiber is the intact part of a plant that is not broken down during digestion. Functional fiber is  fiber that has been isolated from the plant to provide a beneficial effect in the body. PURPOSE  Increase stool bulk.   Ease and regulate bowel movements.   Lower cholesterol.  INDICATIONS THAT YOU NEED MORE FIBER  Constipation and hemorrhoids.   Uncomplicated diverticulosis (intestine condition) and irritable bowel syndrome.   Weight management.   As a protective measure against hardening of the arteries (atherosclerosis), diabetes, and cancer.   GUIDELINES FOR INCREASING FIBER IN THE DIET  Start adding fiber to the diet slowly. A  gradual increase of about 5 more grams (2 slices of whole-wheat bread, 2 servings of most fruits or vegetables, or 1 bowl of high-fiber cereal) per day is best. Too rapid an increase in fiber may result in constipation, flatulence, and bloating.   Drink enough water and fluids to keep your urine clear or pale yellow. Water, juice, or caffeine-free drinks are recommended. Not drinking enough fluid may cause constipation.   Eat a variety of high-fiber foods rather than one type of fiber.   Try to increase your intake of fiber through using high-fiber foods rather than fiber pills or supplements that contain small amounts of fiber.   The goal is to change the types of food eaten. Do not supplement your present diet with high-fiber foods, but replace foods in your present diet.  INCLUDE A VARIETY OF FIBER SOURCES  Replace refined and processed grains with whole grains, canned fruits with fresh fruits, and incorporate other fiber sources. White rice, white breads, and most bakery goods contain little or no fiber.   Brown whole-grain rice, buckwheat oats, and many fruits and vegetables are all good sources of fiber. These include: broccoli, Brussels sprouts, cabbage, cauliflower, beets, sweet potatoes, white potatoes (skin on), carrots, tomatoes, eggplant, squash, berries, fresh fruits, and dried fruits.   Cereals appear to be the richest source of fiber. Cereal fiber is found in whole grains and bran. Bran is the fiber-rich outer coat of cereal grain, which is largely removed in refining. In whole-grain cereals, the bran remains. In breakfast cereals, the largest amount of fiber is found in those with "bran" in their names. The fiber content is sometimes indicated on the label.   You may need to include additional fruits and vegetables each day.   In baking, for 1 cup white flour, you may use the following substitutions:   1 cup whole-wheat flour minus 2 tablespoons.   1/2 cup white flour plus 1/2  cup whole-wheat flour.    Hemorrhoids Hemorrhoids are dilated (enlarged) veins around the rectum. Sometimes clots will form in the veins. This makes them swollen and painful. These are called thrombosed hemorrhoids. Causes of hemorrhoids include:  Constipation.   Straining to have a bowel movement.   HEAVY LIFTING HOME CARE INSTRUCTIONS  Eat a well balanced diet and drink 6 to 8 glasses of water every day to avoid constipation. You may also use a bulk laxative.   Avoid straining to have bowel movements.   Keep anal area dry and clean.   Do not use a donut shaped pillow or sit on the toilet for long periods. This increases blood pooling and pain.   Move your bowels when your body has the urge; this will require less straining and will decrease pain and pressure.

## 2014-02-17 NOTE — H&P (Signed)
Primary Care Physician:  Quentin Cornwall, MD Primary Gastroenterologist:  Dr. Oneida Alar  Pre-Procedure History & Physical: HPI:  Stacy Elliott is a 63 y.o. female here for SCREENING:CROHN'S COLITIS/dysphagia.  Past Medical History  Diagnosis Date  . Mouth sores   . Dysphagia 2002 [facility]/GERD  . GERD (gastroesophageal reflux disease)   . Anxiety disorder   . Depression   . Hyperlipemia   . Hypertension   . Hypothyroidism   . Ileitis, terminal 1993  . History of MRSA infection 2011 NOSE  . Obesity (BMI 30-39.9) 2011 200 LBS    MAR 2012 147 LBS  . Diarrhea INTERMITTENT    JAN 2010 HBT SIBO (1pk 26 ppm), ABX-JAN & MAY 2010  . Inflammatory bowel disease 1993 ?CHRON'S ILEITIS    PATH: NO GRANULOMAS OR CRYPT DISTORTION, NL COLON  . Irritable bowel syndrome DIARRHEA  . Asthma   . PONV (postoperative nausea and vomiting)   . Hiatal hernia   . Arthritis     osteoarthritis  . Vertigo   . Glaucoma   . Crohn's disease     Past Surgical History  Procedure Laterality Date  . Esophagogastroduodenoscopy  2008 DIL DR. ORR    2010 DIL 17 MM SAV-->EGD/DIL RMR MAR 2012  . Thyroid surgery  89    partail thyroidectomy-[place]  . Colon resection  1993 RIGHT    TERMINAL ILEITIS, NO GRANULOMAS/CRYPT DISTORTION  . 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 Colonoscopy  NOV 2011     ILEOTCS-NL  neo-TI & COLON, Bx: nl q10 cm  . Colonoscopy  NOV 2010 ?IBD> 10 YEARS    ILEOTCS-NL  neo-TI & COLON, Bx: nl q10 cm  . Tonsillectomy  age 67    [facility]  . Cholecystectomy      [facility]  . Tubal ligation  age 33    [facility]  . Cataract extraction, bilateral      bilateral  . Bravo ph study  01/09/2011    NON-ACID REFLUX V. NON-ULCER DYSPEPSIA  . Colonoscopy with propofol  01/02/2012    Procedure: COLONOSCOPY WITH PROPOFOL;  Surgeon: Danie Binder, MD;  Location: [facility];  Service: Endoscopy;  Laterality: N/A;  in ileum at 0821  . Back surgery   2003 x2    [place]  . Foot arthrodesis Left 09/04/2013    Procedure: ARTHRODESIS 1st MPJ LEFT FOOT;  Surgeon: Marcheta Grammes, DPM;  Location: [facility];  Service: Podiatry;  Laterality: Left;    Prior to Admission medications   Medication Sig Start Date End Date Taking? Authorizing Provider  ALPRAZolam Duanne Moron) 0.5 MG tablet Take 0.5 mg by mouth 3 (three) times daily.  12/08/10  Yes Historical Provider, MD  aspirin 81 MG chewable tablet Chew 81 mg by mouth daily.    Yes Historical Provider, MD  buPROPion (WELLBUTRIN XL) 300 MG 24 hr tablet Take 300 mg by mouth daily.  12/28/10  Yes Historical Provider, MD  Calcium Carbonate-Vitamin D (CALCIUM PLUS VITAMIN D PO) Take 1 tablet by mouth daily.   Yes Historical Provider, MD  carvedilol (COREG) 12.5 MG tablet Take 12.5 mg by mouth 2 (two) times daily.    Yes Historical Provider, MD  Cholecalciferol (VITAMIN D3) 5000 UNITS CAPS Take 1 capsule by mouth daily.   Yes Historical Provider, MD  esomeprazole (NEXIUM) 40 MG capsule Take 1 capsule (40 mg total) by mouth 2 (two) times daily. 12/31/13  Yes Mahala Menghini, PA-C  estrogen, conjugated,-medroxyprogesterone (PREMPRO) 0.3-1.5 MG per tablet Take 1 tablet by mouth daily.   Yes Historical Provider, MD  losartan-hydrochlorothiazide (HYZAAR) 100-12.5 MG per tablet Take 1 tablet by mouth daily.    Yes Historical Provider, MD  mesalamine (PENTASA) 250 MG CR capsule Take 3 capsules (750 mg total) by mouth 3 (three) times daily. 10/15/13  Yes Orvil Feil, NP  montelukast (SINGULAIR) 10 MG tablet Take 10 mg by mouth at bedtime.    Yes Historical Provider, MD  peg 3350 powder (MOVIPREP) 100 G SOLR Take 1 kit (200 g total) by mouth as directed. 01/14/14  Yes Danie Binder, MD  pravastatin (PRAVACHOL) 80 MG tablet Take 80 mg by mouth at bedtime.  10/03/10  Yes Historical Provider, MD  PROAIR HFA 108 (90 BASE) MCG/ACT inhaler Inhale 2 puffs into the lungs Every 6 hours as needed. For shortness of breath 09/13/11   Yes Historical Provider, MD  Probiotic Product (PROBIOTIC DAILY) CAPS Take 2 capsules by mouth daily. OTC - exact product varies   Yes Historical Provider, MD  promethazine (PHENERGAN) 25 MG tablet Take 25 mg by mouth every 6 (six) hours as needed for nausea or vomiting.   Yes Historical Provider, MD  spironolactone (ALDACTONE) 25 MG tablet Take 25 mg by mouth daily.  11/19/12  Yes Historical Provider, MD  venlafaxine XR (EFFEXOR-XR) 150 MG 24 hr capsule Take 150 mg by mouth daily.  06/20/12  Yes Historical Provider, MD    Allergies as of 02/03/2014 - Review Complete 01/19/2014  Allergen Reaction Noted  . Fentanyl Anaphylaxis   . Codeine Hives   . Imuran [azathioprine] Other (See Comments) 01/02/2011  . Penicillins Hives   . Sulfonamide derivatives Nausea And Vomiting     Family History  Problem Relation Age of Onset  . Anesthesia problems Neg Hx   . Hypotension Neg Hx   . Malignant hyperthermia Neg Hx   . Pseudochol deficiency Neg Hx     History   Social History  . Marital Status: Widowed    Spouse Name: N/A    Number of Children: N/A  . Years of Education: N/A   Occupational History  . Not on file.   Social History Main Topics  . Smoking status: Never Smoker   . Smokeless tobacco: Not on file     Comment: Never smoked  . Alcohol Use: No  . Drug Use: No  . Sexual Activity: Yes    Birth Control/ Protection: Post-menopausal   Other Topics Concern  . Not on file   Social History Narrative    Review of Systems: See HPI, otherwise negative ROS   Physical Exam: BP 135/76 mmHg  Temp(Src) 97.8 F (36.6 C) (Oral)  Resp 43  SpO2 97% General:   Alert,  pleasant and cooperative in NAD Head:  Normocephalic and atraumatic. Neck:  Supple; Lungs:  Clear throughout to auscultation.    Heart:  Regular rate and rhythm. Abdomen:  Soft, nontender and nondistended. Normal bowel sounds, without guarding, and without rebound.   Neurologic:  Alert and  oriented x4;  grossly  normal neurologically.  Impression/Plan:     SCREENING:CROHN'S COLITIS/dysphagia  Plan:  1. TCS/egd/?dil TODAY

## 2014-02-18 ENCOUNTER — Encounter (HOSPITAL_COMMUNITY): Payer: Self-pay | Admitting: Gastroenterology

## 2014-03-05 ENCOUNTER — Telehealth: Payer: Self-pay | Admitting: Gastroenterology

## 2014-03-05 ENCOUNTER — Encounter: Payer: Self-pay | Admitting: Gastroenterology

## 2014-03-05 NOTE — Telephone Encounter (Signed)
Pt called this afternoon saying she had procedure done on 12/22 and she is already getting a bill from it and hasn't heard from [place] regarding her results. DS said if she had them she would've called the patient already. I told patient that the results are not with the nurse at this time and once SF signs off on them that DS would be calling her with her results. Patient is aggravated that she is paying on bills and no one seems to know what is going on and what if it was something serious and someone dies.  I told her that SF was on vacation after Christmas and she is just now getting back and is probably working on getting caught up and the nurse would be in touch with her about her results. Please call her back ASAP

## 2014-03-06 NOTE — Telephone Encounter (Signed)
I called pt and told her that Dr. Oneida Alar is back from vacation and will be signing off on these procedures.  She is fine, just anxious to know results especially since she got billed so quickly and she has paid on a couple of charges this week.

## 2014-03-09 NOTE — Telephone Encounter (Addendum)
Please call pt. HER stomach Bx shows gastritis & BENIGN GASTRIC POLYPS.  Her colon Bx are normal.   CONTINUE NEXIUM. TAKE 30 MINUTES BEFORE MEALS TWICE DAILY. FOLLOW A HIGH FIBER DIET. AVOID ITEMS THAT CAUSE BLOATING.  FOLLOW UP IN 3 MOS-MAR 2016 E30 CROHN'S DISEASE/GERD. Next colonoscopy in 1-3 years.

## 2014-03-10 NOTE — Telephone Encounter (Signed)
Patient is on recall list for march

## 2014-03-10 NOTE — Telephone Encounter (Signed)
Pt is aware of results. 

## 2014-04-27 ENCOUNTER — Encounter: Payer: Self-pay | Admitting: Gastroenterology

## 2014-05-04 ENCOUNTER — Telehealth: Payer: Self-pay | Admitting: Gastroenterology

## 2014-05-04 NOTE — Telephone Encounter (Signed)
PATIENT HAVING BAD GAS AND IT IS CAUSING NAUSEA.  PLEASE CALL PATIENT TO SEE WHAT CAN BE DONE.  PT HAS CHRONES  CALL CELL [phone]  HOME [phone]

## 2014-05-05 NOTE — Telephone Encounter (Signed)
Spoke with the pt- she said she has abd pain all the time, nausea and a lot of gas. She said the nausea and pain are coming from all the gas. She is watching her diet.  She thinks her gastritis is worse now than it was before. She is taking nexium bid and has tried otc gas pills and she said they are not helping. No vomiting, no fever.   She just received her letter on Friday that she needed to call and schedule an appt. Please schedule ov.   Dr.Fields, the pt wants to know if there is anything else she can do about the gastritis and the gas until she comes in for an ov?

## 2014-05-05 NOTE — Telephone Encounter (Signed)
APPT MADE AND PATIENT IS AWARE OF DATE AND TIME

## 2014-05-06 MED ORDER — METRONIDAZOLE 500 MG PO TABS: 500.0000 mg | ORAL_TABLET | Freq: Two times a day (BID) | ORAL | Status: AC

## 2014-05-06 MED ORDER — CIPROFLOXACIN HCL 500 MG PO TABS: ORAL_TABLET | ORAL | Status: AC

## 2014-05-06 NOTE — Telephone Encounter (Addendum)
PLEASE CALL PT. She should avoid dairy until her next appt. TAKE A PROBIOTIC DAILY. TAKE CIPRO/FLAGYL BID FOR 5 DAYS.  CONTINUE NEXIUM. TAKE 30 MINUTES BEFORE MEALS TWICE DAILY. FOLLOW A LOW FAT DIET. AVOID ITEMS THAT CAUSE BLOATING AND GAS.  FOLLOW UP IN APR 2016 E30 CROHN'S DISEASE/SIBO FLARE.

## 2014-05-06 NOTE — Addendum Note (Signed)
Addended by: Danie Binder on: 05/06/2014 03:56 PM   Modules accepted: Orders

## 2014-05-06 NOTE — Telephone Encounter (Signed)
Pt is aware.  

## 2014-05-06 NOTE — Telephone Encounter (Signed)
Pt's OV appt is 06/17/2014 at 9:30 Am with Dr. Oneida Alar.  Routing to Dr. Oneida Alar for advise prior to OV.

## 2014-06-17 ENCOUNTER — Ambulatory Visit (INDEPENDENT_AMBULATORY_CARE_PROVIDER_SITE_OTHER): Payer: Medicare (Managed Care) | Admitting: Gastroenterology

## 2014-06-17 ENCOUNTER — Encounter: Payer: Self-pay | Admitting: Gastroenterology

## 2014-06-17 VITALS — BP 121/77 | HR 77 | Temp 97.6°F | Ht 60.0 in | Wt 168.8 lb

## 2014-06-17 DIAGNOSIS — K219 Gastro-esophageal reflux disease without esophagitis: Secondary | ICD-10-CM

## 2014-06-17 DIAGNOSIS — K6389 Other specified diseases of intestine: Secondary | ICD-10-CM

## 2014-06-17 DIAGNOSIS — K5010 Crohn's disease of large intestine without complications: Secondary | ICD-10-CM

## 2014-06-17 MED ORDER — MESALAMINE ER 250 MG PO CPCR: 750.0000 mg | ORAL_CAPSULE | Freq: Three times a day (TID) | ORAL | Status: AC

## 2014-06-17 MED ORDER — PROMETHAZINE HCL 25 MG PO TABS: 25.0000 mg | ORAL_TABLET | Freq: Four times a day (QID) | ORAL | Status: AC | PRN

## 2014-06-17 MED ORDER — ESOMEPRAZOLE MAGNESIUM 40 MG PO CPDR: 40.0000 mg | DELAYED_RELEASE_CAPSULE | Freq: Two times a day (BID) | ORAL | Status: AC

## 2014-06-17 NOTE — Assessment & Plan Note (Signed)
SYMPTOMS FAIRLY WELL CONTROLLED.  CONTINUE NEXIUM. TAKE 30 MINUTES BEFORE MEALS TWICE DAILY. LOW FAT DIET LOSE WEIGHT FOLLOW UP IN 6 MOS.

## 2014-06-17 NOTE — Patient Instructions (Signed)
AVOID ITEMS THAT CAUSE BLOATING & GAS. SEE INFO BELOW.  FOLLOW A LOW FAT DIET. SEE INFO BELOW.  CONTINUE YOUR WEIGHT LOSS EFFORTS.  CONTINUE NEXIUM. TAKE 30 MINUTES BEFORE MEALS TWICE DAILY.  FOLLOW UP IN 6 MOS.   BLOATING AND GAS PREVENTION  Although gas may be uncomfortable and embarrassing, it is not life-threatening. Understanding causes, ways to reduce symptoms, and treatment will help most people find some relief. Points to remember . Everyone has gas in the digestive tract. Marland Kitchen People often believe normal passage of gas to be excessive. . Gas comes from two main sources: swallowed air and normal breakdown of certain foods by harmless bacteria naturally present in the large intestine. . Many foods with carbohydrates can cause gas. Fats and proteins cause little gas. . Foods that may cause gas include o beans  o vegetables, such as broccoli, cabbage, brussels sprouts, onions, artichokes, and asparagus  o fruits, such as pears, apples, and peaches  o whole grains, such as whole wheat and bran  o soft drinks and fruit drinks  o milk and milk products, such as cheese and ice cream, and packaged foods prepared with lactose, such as bread, cereal, and salad dressing  o foods containing sorbitol, such as dietetic foods and sugar free candies and gums . The most common symptoms of gas are belching, flatulence, bloating, and abdominal pain. However, some of these symptoms are often caused by an intestinal disorder, such as irritable bowel syndrome, rather than too much gas. . The most common ways to reduce the discomfort of gas are changing diet, taking nonprescription medicines, and reducing the amount of air swallowed. . Digestive enzymes, such as lactase supplements, actually help digest carbohydrates and may allow people to eat foods that normally cause gas.   Low-Fat Diet BREADS, CEREALS, PASTA, RICE, DRIED PEAS, AND BEANS These products are high in carbohydrates and most are low in  fat. Therefore, they can be increased in the diet as substitutes for fatty foods. They too, however, contain calories and should not be eaten in excess. Cereals can be eaten for snacks as well as for breakfast.   FRUITS AND VEGETABLES It is good to eat fruits and vegetables. Besides being sources of fiber, both are rich in vitamins and some minerals. They help you get the daily allowances of these nutrients. Fruits and vegetables can be used for snacks and desserts.  MEATS Limit lean meat, chicken, [place], and fish to no more than 6 ounces per day. Beef, Pork, and Lamb Use lean cuts of beef, pork, and lamb. Lean cuts include:  Extra-lean ground beef.  Arm roast.  Sirloin tip.  Center-cut ham.  Round steak.  Loin chops.  Rump roast.  Tenderloin.  Trim all fat off the outside of meats before cooking. It is not necessary to severely decrease the intake of red meat, but lean choices should be made. Lean meat is rich in protein and contains a highly absorbable form of iron. Premenopausal women, in particular, should avoid reducing lean red meat because this could increase the risk for low red blood cells (iron-deficiency anemia).  Chicken and [place] These are good sources of protein. The fat of poultry can be reduced by removing the skin and underlying fat layers before cooking. Chicken and [place] can be substituted for lean red meat in the diet. Poultry should not be fried or covered with high-fat sauces. Fish and Shellfish Fish is a good source of protein. Shellfish contain cholesterol, but they usually are  low in saturated fatty acids. The preparation of fish is important. Like chicken and [place], they should not be fried or covered with high-fat sauces. EGGS Egg whites contain no fat or cholesterol. They can be eaten often. Try 1 to 2 egg whites instead of whole eggs in recipes or use egg substitutes that do not contain yolk. MILK AND DAIRY PRODUCTS Use skim or 1% milk instead of 2% or whole  milk. Decrease whole milk, natural, and processed cheeses. Use nonfat or low-fat (2%) cottage cheese or low-fat cheeses made from vegetable oils. Choose nonfat or low-fat (1 to 2%) yogurt. Experiment with evaporated skim milk in recipes that call for heavy cream. Substitute low-fat yogurt or low-fat cottage cheese for sour cream in dips and salad dressings. Have at least 2 servings of low-fat dairy products, such as 2 glasses of skim (or 1%) milk each day to help get your daily calcium intake. FATS AND OILS Reduce the total intake of fats, especially saturated fat. Butterfat, lard, and beef fats are high in saturated fat and cholesterol. These should be avoided as much as possible. Vegetable fats do not contain cholesterol, but certain vegetable fats, such as coconut oil, palm oil, and palm kernel oil are very high in saturated fats. These should be limited. These fats are often used in bakery goods, processed foods, popcorn, oils, and nondairy creamers. Vegetable shortenings and some peanut butters contain hydrogenated oils, which are also saturated fats. Read the labels on these foods and check for saturated vegetable oils. Unsaturated vegetable oils and fats do not raise blood cholesterol. However, they should be limited because they are fats and are high in calories. Total fat should still be limited to 30% of your daily caloric intake. Desirable liquid vegetable oils are corn oil, cottonseed oil, olive oil, canola oil, safflower oil, soybean oil, and sunflower oil. Peanut oil is not as good, but small amounts are acceptable. Buy a heart-healthy tub margarine that has no partially hydrogenated oils in the ingredients. Mayonnaise and salad dressings often are made from unsaturated fats, but they should also be limited because of their high calorie and fat content. Seeds, nuts, peanut butter, olives, and avocados are high in fat, but the fat is mainly the unsaturated type. These foods should be limited mainly  to avoid excess calories and fat. OTHER EATING TIPS Snacks  Most sweets should be limited as snacks. They tend to be rich in calories and fats, and their caloric content outweighs their nutritional value. Some good choices in snacks are graham crackers, melba toast, soda crackers, bagels (no egg), English muffins, fruits, and vegetables. These snacks are preferable to snack crackers, [place] fries, TORTILLA CHIPS, and POTATO chips. Popcorn should be air-popped or cooked in small amounts of liquid vegetable oil. Desserts Eat fruit, low-fat yogurt, and fruit ices instead of pastries, cake, and cookies. Sherbet, angel food cake, gelatin dessert, frozen low-fat yogurt, or other frozen products that do not contain saturated fat (pure fruit juice bars, frozen ice pops) are also acceptable.  COOKING METHODS Choose those methods that use little or no fat. They include: Poaching.  Braising.  Steaming.  Grilling.  Baking.  Stir-frying.  Broiling.  Microwaving.  Foods can be cooked in a nonstick pan without added fat, or use a nonfat cooking spray in regular cookware. Limit fried foods and avoid frying in saturated fat. Add moisture to lean meats by using water, broth, cooking wines, and other nonfat or low-fat sauces along with the cooking methods mentioned  above. Soups and stews should be chilled after cooking. The fat that forms on top after a few hours in the refrigerator should be skimmed off. When preparing meals, avoid using excess salt. Salt can contribute to raising blood pressure in some people.  EATING AWAY FROM HOME Order entres, potatoes, and vegetables without sauces or butter. When meat exceeds the size of a deck of cards (3 to 4 ounces), the rest can be taken home for another meal. Choose vegetable or fruit salads and ask for low-calorie salad dressings to be served on the side. Use dressings sparingly. Limit high-fat toppings, such as bacon, crumbled eggs, cheese, sunflower seeds, and  olives. Ask for heart-healthy tub margarine instead of butter.

## 2014-06-17 NOTE — Assessment & Plan Note (Signed)
SYMPTOMS FAIRLY WELL CONTROLLED. STILL TROUBLED BY FLATULENCE.  AVOID ITEMS THAT CAUSE BLOATING & GAS.  HO GIVEN. FOLLOW UP IN 6 MOS.

## 2014-06-17 NOTE — Progress Notes (Signed)
cc'ed to pcp °

## 2014-06-17 NOTE — Assessment & Plan Note (Addendum)
SYMPTOMS FAIRLY WELL CONTROLLED. SURGICAL REMISSION ACHIEVED.  CONTINUE PENTASA. CONTINUE TO MONITOR SYMPTOMS. FOLLOW UP IN 6 MOS.

## 2014-06-17 NOTE — Progress Notes (Signed)
ON RECALL LIST  °

## 2014-06-17 NOTE — Progress Notes (Signed)
Subjective:    Patient ID: Stacy Elliott, female    DOB: [birth year], 64 y.o.   MRN: [id number]  Quentin Cornwall, MD  HPI HAS SO MUCH GAS. CAN'T STAND HERSELF. GAINED WEIGHT. PAIN IN LEFT SIDE A LOT. PROBIOTIC BID. LAST ABX:? HELPED MAR 2016. NEEDS REFILLS ON PENTASA, NEXIUM, AND PHENERGAN.    Past Medical History  Diagnosis Date  . Mouth sores   . Dysphagia 2002 [facility] HH/GERD  . GERD (gastroesophageal reflux disease)   . Anxiety disorder   . Depression   . Hyperlipemia   . Hypertension   . Hypothyroidism   . Ileitis, terminal 1993  . History of MRSA infection 2011 NOSE  . Obesity (BMI 30-39.9) 2011 200 LBS    MAR 2012 147 LBS  . Diarrhea INTERMITTENT    JAN 2010 HBT SIBO (1pk 26 ppm), ABX-JAN & MAY 2010  . Inflammatory bowel disease 1993 ?CHRON'S ILEITIS    PATH: NO GRANULOMAS OR CRYPT DISTORTION, NL COLON  . Irritable bowel syndrome DIARRHEA  . Asthma   . PONV (postoperative nausea and vomiting)   . Hiatal hernia   . Arthritis     osteoarthritis  . Vertigo   . Glaucoma   . Crohn's disease     Past Surgical History  Procedure Laterality Date  . Esophagogastroduodenoscopy  2008 DIL DR. ORR    2010 DIL 17 MM SAV-->EGD/DIL RMR MAR 2012  . Thyroid surgery  89    partail thyroidectomy-[place]  . Colon resection  1993 RIGHT    TERMINAL ILEITIS, NO GRANULOMAS/CRYPT DISTORTION  . 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 Colonoscopy  NOV 2011     ILEOTCS-NL  neo-TI & COLON, Bx: nl q10 cm  . Colonoscopy  NOV 2010 ?IBD> 10 YEARS    ILEOTCS-NL  neo-TI & COLON, Bx: nl q10 cm  . Tonsillectomy  age 62    [facility]  . Cholecystectomy      [place]  . Tubal ligation  age 65    [place]  . Cataract extraction, bilateral      bilateral  . Bravo ph study  01/09/2011    NON-ACID REFLUX V. NON-ULCER DYSPEPSIA  . Colonoscopy with propofol  01/02/2012    Procedure: COLONOSCOPY WITH PROPOFOL;  Surgeon: Danie Binder, MD;   Location: [facility];  Service: Endoscopy;  Laterality: N/A;  in ileum at 0821  . Back surgery  2003 x2    [place]  . Foot arthrodesis Left 09/04/2013    Procedure: ARTHRODESIS 1st MPJ LEFT FOOT;  Surgeon: Marcheta Grammes, DPM;  Location: [facility];  Service: Podiatry;  Laterality: Left;  . Colonoscopy with propofol N/A 02/17/2014    Procedure: COLONOSCOPY WITH PROPOFOL;  Surgeon: Danie Binder, MD;  Location: [facility];  Service: Endoscopy;  Laterality: N/A;  at anastomosis @ [phone]; cecal withdrawal time= 33 min  . Esophagogastroduodenoscopy (egd) with propofol N/A 02/17/2014    Procedure: ESOPHAGOGASTRODUODENOSCOPY (EGD) WITH PROPOFOL;  Surgeon: Danie Binder, MD;  Location: [facility];  Service: Endoscopy;  Laterality: N/A;  . Savory dilation N/A 02/17/2014    Procedure: SAVORY DILATION;  Surgeon: Danie Binder, MD;  Location: [facility];  Service: Endoscopy;  Laterality: N/A;  used #14,15,16 dilators  . Esophageal biopsy N/A 02/17/2014    Procedure: BIOPSY;  Surgeon: Danie Binder, MD;  Location: [facility];  Service: Endoscopy;  Laterality: N/A;  random colon biopsies; gastric biopsies  . Polypectomy N/A  02/17/2014    Procedure: POLYPECTOMY;  Surgeon: Danie Binder, MD;  Location: [facility];  Service: Endoscopy;  Laterality: N/A;  gastric polyps   Allergies  Allergen Reactions  . Fentanyl Anaphylaxis    Fentanyl patch is  What causes allergies. Anesthesia aware  . Erythromycin Other (See Comments)    Thrush to tongue, lasting 6 months  . Codeine Hives  . Imuran [Azathioprine] Other (See Comments)    Chemically induced meningitis   . Penicillins Hives  . Sulfonamide Derivatives Nausea And Vomiting    Current Outpatient Prescriptions  Medication Sig Dispense Refill  . ALPRAZolam (XANAX) 0.5 MG tablet Take 0.5 mg by mouth 3 (three) times daily.     Marland Kitchen aspirin 81 MG chewable tablet Chew 81 mg by mouth daily.     Marland Kitchen buPROPion (WELLBUTRIN XL) 300 MG 24 hr tablet Take 300 mg by mouth daily.     .  Calcium Carbonate-Vitamin D (CALCIUM PLUS VITAMIN D PO) Take 1 tablet by mouth daily.    . carvedilol (COREG) 12.5 MG tablet Take 12.5 mg by mouth 2 (two) times daily.     . Cholecalciferol (VITAMIN D3) 5000 UNITS CAPS Take 1 capsule by mouth daily.    Marland Kitchen esomeprazole (NEXIUM) 40 MG capsule Take 1 capsule (40 mg total) by mouth 2 (two) times daily.    Marland Kitchen estrogen, conjugated,-medroxyprogesterone (PREMPRO) 0.3-1.5 MG per tablet Take 1 tablet by mouth daily.    Marland Kitchen losartan-hydrochlorothiazide (HYZAAR) 100-12.5 MG per tablet Take 1 tablet by mouth daily.     . mesalamine (PENTASA) 250 MG CR capsule Take 3 capsules (750 mg total) by mouth 3 (three) times daily.    . montelukast (SINGULAIR) 10 MG tablet Take 10 mg by mouth at bedtime.     . pravastatin (PRAVACHOL) 80 MG tablet Take 80 mg by mouth at bedtime.     Marland Kitchen PROAIR HFA 108 (90 BASE) MCG/ACT inhaler Inhale 2 puffs into the lungs Every 6 hours as needed. For shortness of breath    . Probiotic Product (PROBIOTIC DAILY) CAPS Take 2 capsules by mouth daily. OTC - exact product varies    . promethazine (PHENERGAN) 25 MG tablet Take 25 mg by mouth every 6 (six) hours as needed for nausea or vomiting.    Marland Kitchen spironolactone (ALDACTONE) 25 MG tablet Take 25 mg by mouth daily.     Marland Kitchen venlafaxine XR (EFFEXOR-XR) 150 MG 24 hr capsule Take 150 mg by mouth daily.     .      .       Review of Systems     Objective:   Physical Exam  Constitutional: She is oriented to person, place, and time. She appears well-developed and well-nourished. No distress.  HENT:  Head: Normocephalic and atraumatic.  Mouth/Throat: Oropharynx is clear and moist. No oropharyngeal exudate.  Eyes: Pupils are equal, round, and reactive to light. No scleral icterus.  Neck: Normal range of motion. Neck supple.  Cardiovascular: Normal rate, regular rhythm and normal heart sounds.   Pulmonary/Chest: Effort normal and breath sounds normal. No respiratory distress.  Abdominal: Soft. Bowel  sounds are normal. She exhibits no distension. There is tenderness. There is no rebound and no guarding.  MILD LUQ TTP   Musculoskeletal: She exhibits no edema.  Lymphadenopathy:    She has no cervical adenopathy.  Neurological: She is alert and oriented to person, place, and time.  Psychiatric: She has a normal mood and affect.  Vitals reviewed.  Assessment & Plan:

## 2014-07-20 ENCOUNTER — Telehealth: Payer: Self-pay

## 2014-07-20 NOTE — Telephone Encounter (Signed)
Pt called because she is having trouble with her stomach. She is having some foam coming up in her throat . She has been taking Nexium for a long time and is wanting to know if she needs something different. Please advise

## 2014-07-20 NOTE — Telephone Encounter (Signed)
PLEASE CALL PT. SHE SHOULD CONTINUE [place]. SHE SHOULD WATCH WHAT SHE IS EATING AND AVOID TRIGGERS FOR REFLUX AND REGURGITATION. CONTINUE HER WEIGHT LOSS EFFORTS.

## 2014-07-20 NOTE — Telephone Encounter (Signed)
Pt is aware.  

## 2014-10-05 ENCOUNTER — Telehealth: Payer: Self-pay | Admitting: Gastroenterology

## 2014-10-05 NOTE — Telephone Encounter (Signed)
Routing to Dr Oneida Alar for her input

## 2014-10-05 NOTE — Telephone Encounter (Signed)
PATIENT WANTED RELEASE SENT TO HER TO HAVE HER RECORDS SENT ELSEWHERE.  SAID SHE HAS BEEN TOLD BY THE NURSE TO CONTINUE MEDICATION AND CHANGE HER DIET AND SHE IS HAVING PROBLEMS THAT HAVE BEEN ONGOING SINCE December.  I TOLD HER THE NEXT AVAILABLE WITH [facility] AND SHE SAID SHE NEEDED THIS WEEK.  I EXPLAINED TO HER THAT I COULD PROBABLY GET HER IN SOONER WITH AN EXTENDER AND SHE SAID NO SHE WILL JUST FIND ANOTHER DOCTOR.

## 2014-10-05 NOTE — Telephone Encounter (Signed)
HAD A CANCEL FOR Tuesday 10/06/14 AND NOTIFIED PATIENT AND SHE DID NOT WANT APPOINTMENT BECAUSE IT WAS WITH A NP AND NOT A MD  SAID TO MAIL RELEASE

## 2014-10-06 NOTE — Telephone Encounter (Signed)
REVIEWED. RELEASE RECORDS. PT DESIRES TO HAVE ANOTHER DOCTOR.

## 2014-10-08 NOTE — Telephone Encounter (Signed)
Stacy Elliott has already mailed patient a release of records form earlier this week.

## 2014-10-08 NOTE — Telephone Encounter (Signed)
Stacy Elliott please see recommendations from [facility] and have patient sign a release for [employer] to process if she wants a copy of her record.  If she would like [place] to forward them to another doctor, we will be happy to do so.

## 2014-10-15 ENCOUNTER — Telehealth: Payer: Self-pay | Admitting: Gastroenterology

## 2014-10-15 NOTE — Telephone Encounter (Signed)
REVIEWED.  

## 2014-10-15 NOTE — Telephone Encounter (Signed)
Patient has filled out a release of records and they have been sent to [phone]

## 2014-11-04 ENCOUNTER — Encounter: Payer: Self-pay | Admitting: Gastroenterology

## 2014-11-11 ENCOUNTER — Other Ambulatory Visit: Payer: Self-pay | Admitting: Gastroenterology

## 2014-11-11 ENCOUNTER — Other Ambulatory Visit: Payer: Self-pay | Admitting: Physician Assistant

## 2014-11-11 DIAGNOSIS — R1310 Dysphagia, unspecified: Secondary | ICD-10-CM

## 2014-11-11 DIAGNOSIS — K219 Gastro-esophageal reflux disease without esophagitis: Secondary | ICD-10-CM

## 2014-11-13 ENCOUNTER — Ambulatory Visit
Admission: RE | Admit: 2014-11-13 | Discharge: 2014-11-13 | Disposition: A | Discharge status: Home / Self Care | Payer: Medicare (Managed Care) | Source: Ambulatory Visit | Attending: Gastroenterology | Admitting: Gastroenterology

## 2014-11-13 DIAGNOSIS — R1310 Dysphagia, unspecified: Secondary | ICD-10-CM

## 2014-11-13 DIAGNOSIS — K219 Gastro-esophageal reflux disease without esophagitis: Secondary | ICD-10-CM

## 2014-11-18 ENCOUNTER — Other Ambulatory Visit: Payer: Self-pay | Admitting: Gastroenterology

## 2014-11-18 NOTE — Addendum Note (Signed)
Addended by: Arta Silence on: 11/18/2014 10:22 AM   Modules accepted: Orders

## 2014-12-11 ENCOUNTER — Ambulatory Visit (HOSPITAL_COMMUNITY)
Admission: RE | Admit: 2014-12-11 | Discharge: 2014-12-11 | Disposition: A | Discharge status: Home / Self Care | Payer: Medicare Other | Source: Ambulatory Visit | Attending: Gastroenterology | Admitting: Gastroenterology

## 2014-12-11 ENCOUNTER — Encounter (HOSPITAL_COMMUNITY)
Admission: RE | Disposition: A | Discharge status: Home / Self Care | Payer: Self-pay | Source: Ambulatory Visit | Attending: Gastroenterology

## 2014-12-11 DIAGNOSIS — R1310 Dysphagia, unspecified: Secondary | ICD-10-CM | POA: Insufficient documentation

## 2014-12-11 DIAGNOSIS — K219 Gastro-esophageal reflux disease without esophagitis: Secondary | ICD-10-CM | POA: Insufficient documentation

## 2014-12-11 HISTORY — PX: ESOPHAGEAL MANOMETRY: SHX5429

## 2014-12-11 SURGERY — MANOMETRY, ESOPHAGUS

## 2014-12-11 MED ORDER — LIDOCAINE VISCOUS 2 % MT SOLN
OROMUCOSAL | Status: AC
  Filled 2014-12-11: qty 15

## 2014-12-11 SURGICAL SUPPLY — 2 items
FACESHIELD LNG OPTICON STERILE (SAFETY) IMPLANT
GLOVE BIO SURGEON STRL SZ8 (GLOVE) ×6 IMPLANT

## 2014-12-14 ENCOUNTER — Encounter (HOSPITAL_COMMUNITY): Payer: Self-pay | Admitting: Gastroenterology

## 2015-02-10 ENCOUNTER — Encounter: Payer: Self-pay | Admitting: Gastroenterology

## 2016-08-15 IMAGING — US US SOFT TISSUE HEAD/NECK
1 series · 13 of 25 positions shown · non-contrast
Comparison: 01/13/2013

CLINICAL DATA: 63-year-old female with a history of multinodular
thyroid.

EXAM:
THYROID ULTRASOUND
TECHNIQUE: Ultrasound examination of the thyroid gland and adjacent soft
tissues was performed.

[Series 1: us soft tissue head/neck · 0.05mm/px · 13 of 51 slices shown]
[im 1/51]
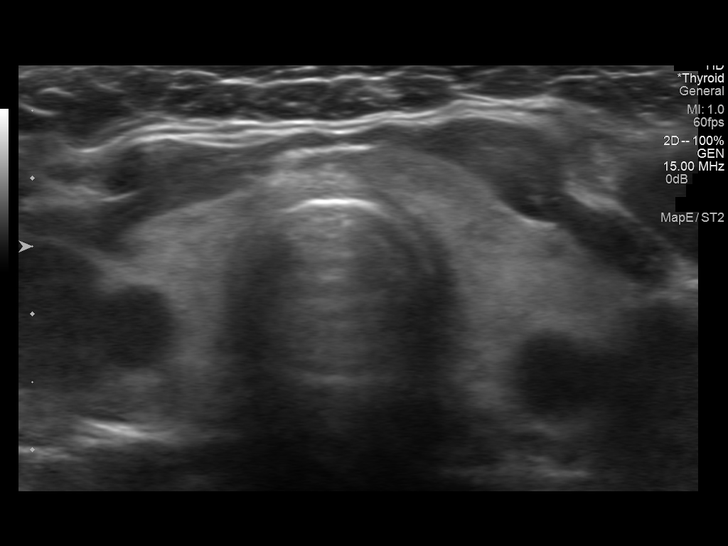
[im 5/51]
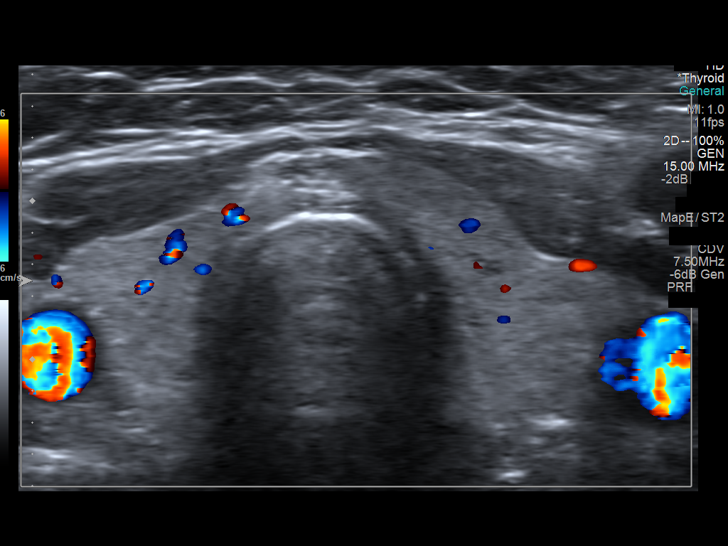
[im 9/51]
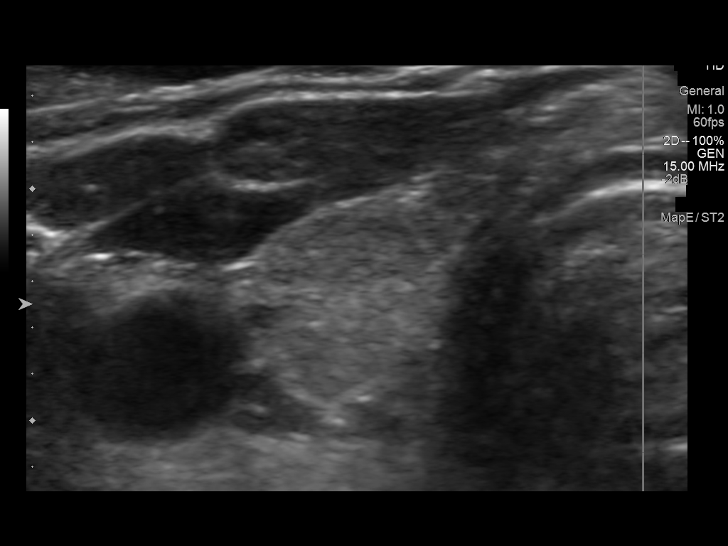
[im 13/51]
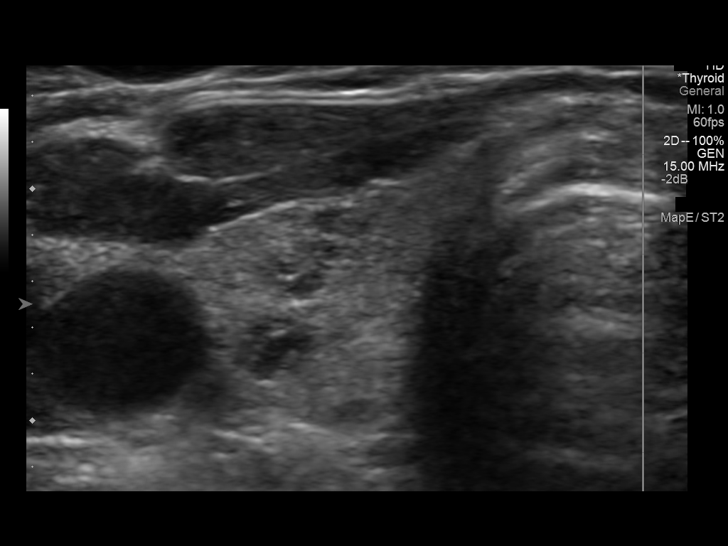
[im 17/51]
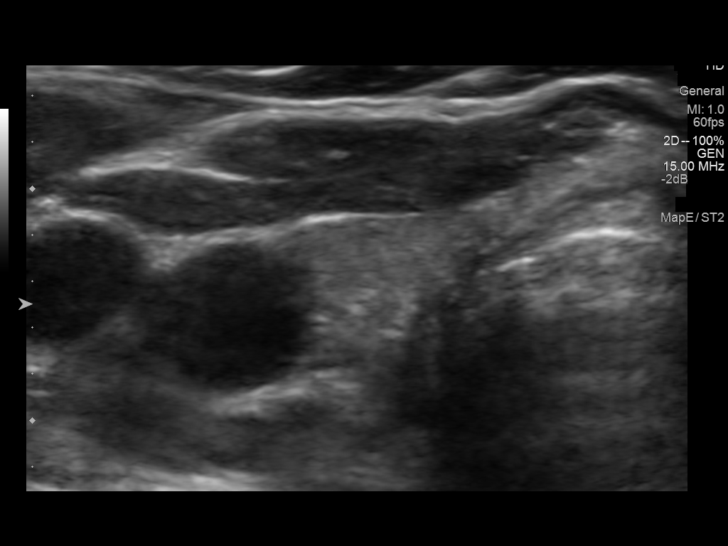
[im 21/51]
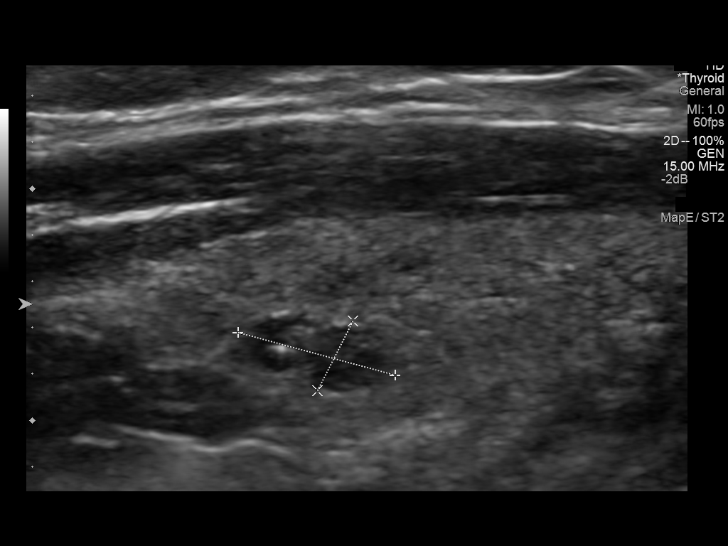
[im 26/51]
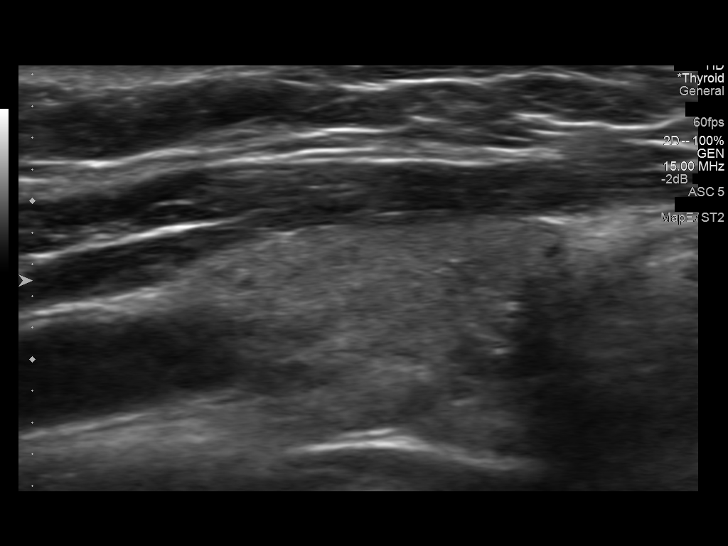
[im 30/51]
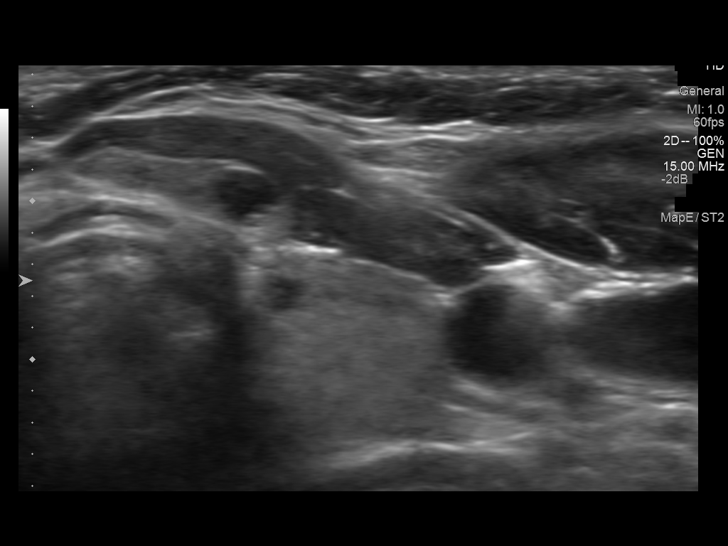
[im 34/51]
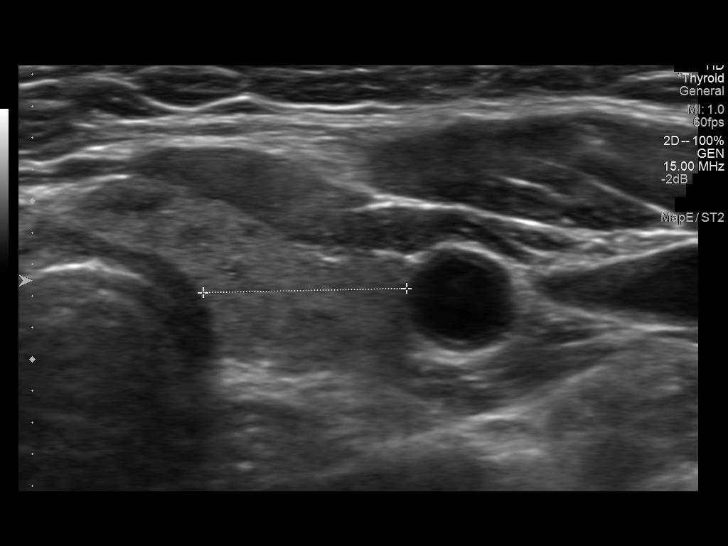
[im 38/51]
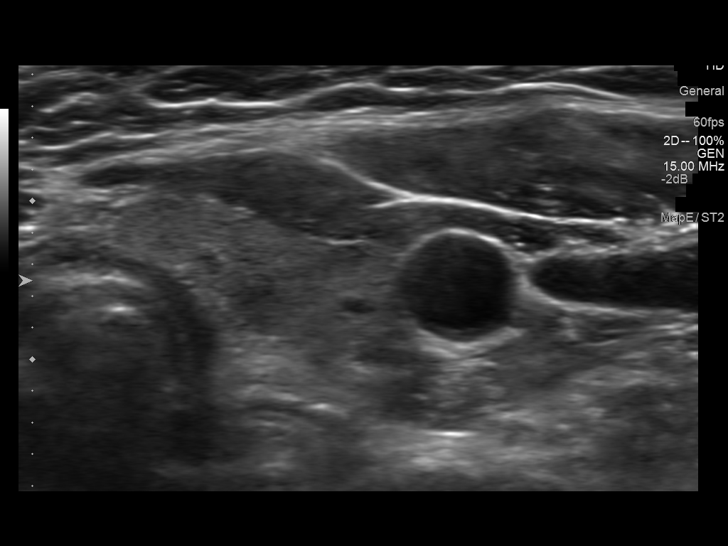
[im 42/51]
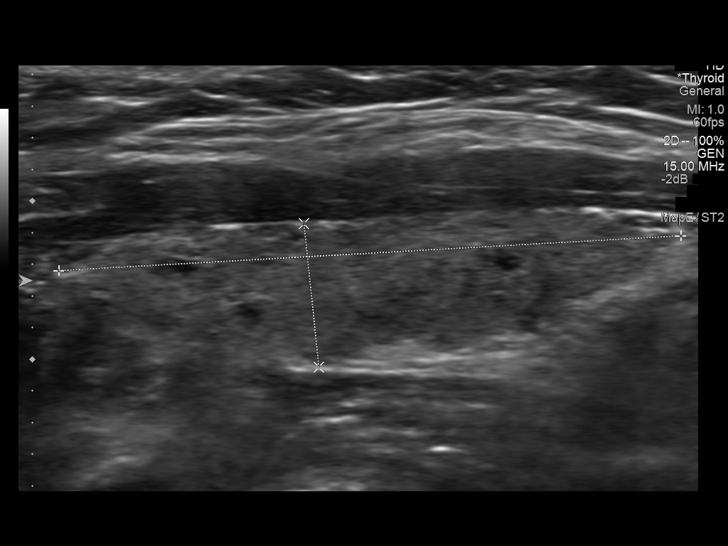
[im 46/51]
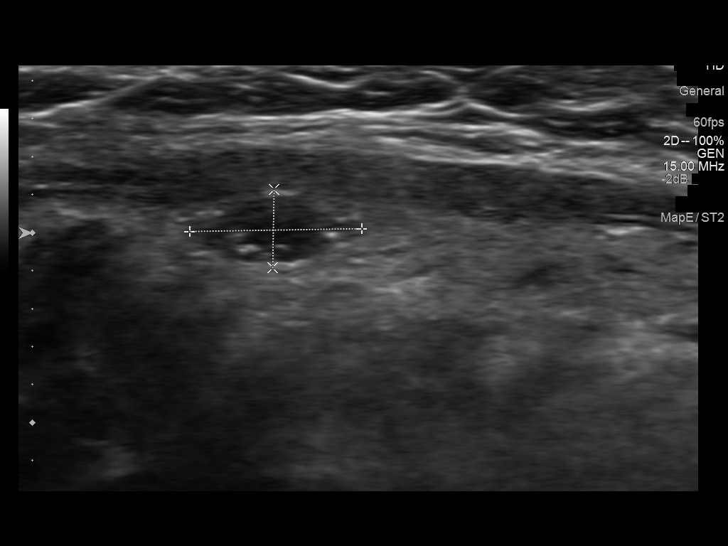
[im 51/51]
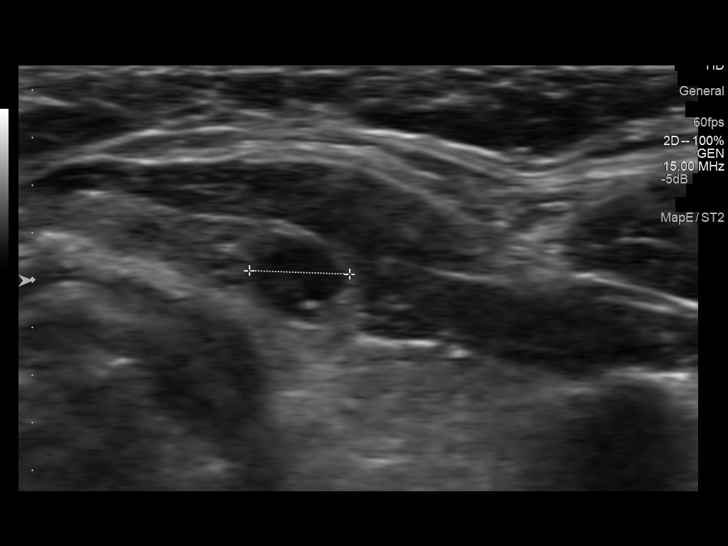

[13 of 25 positions shown; findings below may reference images not displayed]

FINDINGS: Right thyroid lobe

Measurements: 2.8 cm x 1.1 cm x 1.0 cm. There are several hypoechoic
foci within the right thyroid lobe. Dominant nodule measures 7 mm x
3 mm x 4 mm with internal calcification.

Left thyroid lobe

Measurements: 3.9 cm x 9 mm x 1.3 cm. Dominant left-sided nodule
with internal reflectors, either colloid or calcifications. This
nodule measures 7 mm x 4 mm x 4 mm.

Isthmus

Thickness: 3 mm.  No nodules visualized.

Lymphadenopathy

None visualized.
IMPRESSION: Multinodular thyroid. The largest nodules do not meet criteria for
biopsy.

Findings do not meet current SRU consensus criteria for biopsy.
Follow-up by clinical exam is recommended. If patient has known risk
factors for thyroid carcinoma, consider follow-up ultrasound in 12
months. If patient is clinically hyperthyroid, consider nuclear
medicine thyroid uptake and scan.Reference: Management of Thyroid
Nodules Detected at US: Society of Radiologists in Ultrasound

## 2019-11-06 ENCOUNTER — Ambulatory Visit: Payer: Medicare (Managed Care) | Admitting: Physician Assistant
